# Patient Record
Sex: Female | Born: 1995 | Race: White | Hispanic: No | Smoking: Never smoker
Health system: Southern US, Community
[De-identification: ages and names within clinical notes are randomized; demographics above are authoritative.]

## PROBLEM LIST (undated history)

## (undated) ENCOUNTER — Emergency Department (HOSPITAL_COMMUNITY): Admission: EM | Payer: Medicaid Other

## (undated) DIAGNOSIS — M329 Systemic lupus erythematosus, unspecified: Secondary | ICD-10-CM

## (undated) DIAGNOSIS — IMO0002 Reserved for concepts with insufficient information to code with codable children: Secondary | ICD-10-CM

## (undated) DIAGNOSIS — D649 Anemia, unspecified: Secondary | ICD-10-CM

## (undated) HISTORY — DX: Systemic lupus erythematosus, unspecified: M32.9

## (undated) HISTORY — PX: WISDOM TOOTH EXTRACTION: SHX21

## (undated) HISTORY — DX: Anemia, unspecified: D64.9

## (undated) HISTORY — DX: Reserved for concepts with insufficient information to code with codable children: IMO0002

---

## 2017-03-10 ENCOUNTER — Other Ambulatory Visit: Payer: Self-pay

## 2017-03-10 ENCOUNTER — Emergency Department (HOSPITAL_COMMUNITY)
Admission: EM | Admit: 2017-03-10 | Discharge: 2017-03-11 | Disposition: A | Discharge status: Home / Self Care | Payer: No Typology Code available for payment source | Attending: Emergency Medicine | Admitting: Emergency Medicine

## 2017-03-10 ENCOUNTER — Encounter (HOSPITAL_COMMUNITY): Payer: Self-pay | Admitting: Emergency Medicine

## 2017-03-10 ENCOUNTER — Emergency Department (HOSPITAL_COMMUNITY): Payer: No Typology Code available for payment source

## 2017-03-10 DIAGNOSIS — Y9389 Activity, other specified: Secondary | ICD-10-CM | POA: Diagnosis not present

## 2017-03-10 DIAGNOSIS — Y999 Unspecified external cause status: Secondary | ICD-10-CM | POA: Diagnosis not present

## 2017-03-10 DIAGNOSIS — R51 Headache: Secondary | ICD-10-CM | POA: Insufficient documentation

## 2017-03-10 DIAGNOSIS — Y92410 Unspecified street and highway as the place of occurrence of the external cause: Secondary | ICD-10-CM | POA: Diagnosis not present

## 2017-03-10 MED ORDER — ACETAMINOPHEN 325 MG PO TABS
650.0000 mg | ORAL_TABLET | Freq: Once | ORAL | Status: AC
  Administered 2017-03-10: 650 mg via ORAL
  Filled 2017-03-10: qty 2

## 2017-03-10 MED ORDER — IBUPROFEN 600 MG PO TABS: 600.0000 mg | ORAL_TABLET | Freq: Four times a day (QID) | ORAL | 0 refills | Status: DC | PRN

## 2017-03-10 MED ORDER — METHOCARBAMOL 500 MG PO TABS: 500.0000 mg | ORAL_TABLET | Freq: Two times a day (BID) | ORAL | 0 refills | Status: DC
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
## 2019-03-26 ENCOUNTER — Encounter: Payer: Self-pay | Admitting: General Practice

## 2019-04-01 ENCOUNTER — Other Ambulatory Visit: Payer: Self-pay

## 2019-04-01 ENCOUNTER — Ambulatory Visit (INDEPENDENT_AMBULATORY_CARE_PROVIDER_SITE_OTHER): Payer: Medicaid Other | Admitting: *Deleted

## 2019-04-01 DIAGNOSIS — Z3400 Encounter for supervision of normal first pregnancy, unspecified trimester: Secondary | ICD-10-CM | POA: Insufficient documentation

## 2019-04-01 MED ORDER — BLOOD PRESSURE MONITOR AUTOMAT DEVI: 1.0000 | Freq: Every day | 0 refills | Status: DC

## 2019-04-01 MED ORDER — GOJJI WEIGHT SCALE MISC: 1.0000 | Freq: Every day | 0 refills | Status: DC | PRN

## 2019-04-01 NOTE — Progress Notes (Signed)
  Virtual Visit via Telephone Note  I connected with Maysun Tess on 04/01/19 at  3:00 PM EST by telephone and verified that I am speaking with the correct person using two identifiers.  Location: Patient: Cheryl Davila MRN: [id number] Provider: Clovis Pu, RN   I discussed the limitations, risks, security and privacy concerns of performing an evaluation and management service by telephone and the availability of in person appointments. I also discussed with the patient that there may be a patient responsible charge related to this service. The patient expressed understanding and agreed to proceed.   History of Present Illness: PRENATAL INTAKE SUMMARY  Ms. Backhaus presents today New OB Nurse Interview.  OB History    Gravida  1   Para      Term      Preterm      AB      Living        SAB      TAB      Ectopic      Multiple      Live Births             I have reviewed the patient's medical, obstetrical, social, and family histories, medications, and available lab results.  SUBJECTIVE She complains of nausea without vomiting.   Observations/Objective: Initial nurse interview for history/labs (New OB)  EDD: 10/15/2019 by LMP GA: [redacted]w[redacted]d G1P0 FHT: non face to face interview  GENERAL APPEARANCE: non face to face interview  Assessment and Plan: Normal pregnancy Prenatal care-[facility] Patient complained of nausea but declined medication. Advised to try Vitamin B-6 100 mg BID, ginger ale and saltine crackers. Labs to be completed at next visit with midwife Next appointment 04/16/19 with Steward Drone, CNM Patient to sign up for [employer] Rx for BP monitor and weight scale sent to [facility] Patient to continue PNV   Follow Up Instructions:   I discussed the assessment and treatment plan with the patient. The patient was provided an opportunity to ask questions and all were answered. The patient agreed with the plan and demonstrated an  understanding of the instructions.   The patient was advised to call back or seek an in-person evaluation if the symptoms worsen or if the condition fails to improve as anticipated.  I provided 20 minutes of non-face-to-face time during this encounter.   Clovis Pu, RN

## 2019-04-16 ENCOUNTER — Ambulatory Visit (INDEPENDENT_AMBULATORY_CARE_PROVIDER_SITE_OTHER): Payer: Medicaid Other | Admitting: Certified Nurse Midwife

## 2019-04-16 ENCOUNTER — Encounter: Payer: Self-pay | Admitting: Certified Nurse Midwife

## 2019-04-16 ENCOUNTER — Other Ambulatory Visit: Payer: Self-pay

## 2019-04-16 ENCOUNTER — Other Ambulatory Visit (HOSPITAL_COMMUNITY)
Admission: RE | Admit: 2019-04-16 | Discharge: 2019-04-16 | Disposition: A | Discharge status: Home / Self Care | Payer: Medicaid Other | Source: Ambulatory Visit | Attending: Certified Nurse Midwife | Admitting: Certified Nurse Midwife

## 2019-04-16 DIAGNOSIS — Z3400 Encounter for supervision of normal first pregnancy, unspecified trimester: Secondary | ICD-10-CM | POA: Diagnosis not present

## 2019-04-16 DIAGNOSIS — Z3A14 14 weeks gestation of pregnancy: Secondary | ICD-10-CM | POA: Diagnosis not present

## 2019-04-16 NOTE — Progress Notes (Signed)
History:   Cheryl Davila is a 24 y.o. G1P0 at [redacted]w[redacted]d by LMP being seen today for her first obstetrical visit.  Her obstetrical history is significant for nothing. Patient does intend to breast feed. Pregnancy history fully reviewed.  Patient reports vaginal discharge. She reports increased vaginal discharge over the past 2 weeks, describes as white thin discharge without odor. Denies irritation or itching. She reports having nausea and vomiting in the first trimester which she used ginger as treatment, reports N/V has resolved.      HISTORY: OB History  Gravida Para Term Preterm AB Living  1 0 0 0 0 0  SAB TAB Ectopic Multiple Live Births  0 0 0 0 0    # Outcome Date GA Lbr Len/2nd Weight Sex Delivery Anes PTL Lv  1 Current             Last pap smear was done 2020 at [facility] and was normal per patient, request of records sent to [facility]   History reviewed. No pertinent past medical history. History reviewed. No pertinent surgical history. Family History  Problem Relation Age of Onset  . Depression Mother   . Diabetes Maternal Grandmother    Social History   Tobacco Use  . Smoking status: Never Smoker  . Smokeless tobacco: Never Used  Substance Use Topics  . Alcohol use: No  . Drug use: No   No Known Allergies Current Outpatient Medications on File Prior to Visit  Medication Sig Dispense Refill  . Blood Pressure Monitoring (BLOOD PRESSURE MONITOR AUTOMAT) DEVI 1 Device by Does not apply route daily. Automatic blood pressure cuff regular size. To monitor blood pressure regularly at home. ICD-10 code: O09.90 1 each 0  . Misc. Devices (GOJJI WEIGHT SCALE) MISC 1 Device by Does not apply route daily as needed. To weight self daily as needed at home. ICD-10 code: O09.90 1 each 0  . Prenatal Vit-Fe Fumarate-FA (M-NATAL PLUS) 27-1 MG TABS Take 1 tablet by mouth daily.     No current facility-administered medications on file prior to visit.    Review of Systems Pertinent items noted  in HPI and remainder of comprehensive ROS otherwise negative. Physical Exam:   Vitals:   04/16/19 1528  BP: 112/70  Pulse: 85  Temp: 98.3 F (36.8 C)  Weight: 162 lb 9.6 oz (73.8 kg)   Fetal Heart Rate (bpm): 159 System: General: well-developed, well-nourished female in no acute distress   Breasts:  normal appearance, no masses or tenderness bilaterally   Skin: normal coloration and turgor, no rashes   Neurologic: oriented, normal, negative, normal mood   Extremities: normal strength, tone, and muscle mass, ROM of all joints is normal   HEENT PERRLA, extraocular movement intact and sclera clear, anicteric   Mouth/Teeth mucous membranes moist, pharynx normal without lesions and dental hygiene good   Neck supple and no masses   Cardiovascular: regular rate and rhythm   Respiratory:  no respiratory distress, normal breath sounds   Abdomen: soft, non-tender; bowel sounds normal; no masses,  no organomegaly     Assessment:    Pregnancy: G1P0 Patient Active Problem List   Diagnosis Date Noted  . Supervision of normal first pregnancy, antepartum 04/01/2019     Plan:    1. Supervision of normal first pregnancy, antepartum - Welcomed to practice and introduced self to patient  - Reviewed safety, visitor policy, reassurance about COVID-19 for pregnancy at this time. Discussed possible changes to visits, including televisits, that may occur  due to COVID-19.  The office remains open if pt needs to be seen and [facility]U is open 24 hours/day for OB emergencies. - Anticipatory guidance on upcoming appointments  - Educated and discussed AFP at next prenatal visit  - Discussed safe medications during pregnancy and list given  - Cervicovaginal ancillary only( [place]) - Obstetric Panel, Including HIV - Genetic Screening - Culture, OB Urine - Hepatitis C Antibody - [place] MFM OB COMP + 14 WK; Future   Initial labs drawn. Continue prenatal vitamins. Genetic Screening discussed, NIPS:  ordered. Ultrasound discussed; fetal anatomic survey: ordered. Problem list reviewed and updated. The nature of [facility] with multiple MDs and other Advanced Practice Providers was explained to patient; also emphasized that residents, students are part of our team. Routine obstetric precautions reviewed. Return in about 4 weeks (around 05/14/2019) for ROB/AFP.     Lajean Manes, [place] for [employer], [place]

## 2019-04-16 NOTE — Patient Instructions (Signed)
Second Trimester of Pregnancy  The second trimester is from week 14 through week 27 (month 4 through 6). This is often the time in pregnancy that you feel your best. Often times, morning sickness has lessened or quit. You may have more energy, and you may get hungry more often. Your unborn baby is growing rapidly. At the end of the sixth month, he or she is about 9 inches long and weighs about 1 pounds. You will likely feel the baby move between 18 and 20 weeks of pregnancy. Follow these instructions at home: Medicines  Take over-the-counter and prescription medicines only as told by your doctor. Some medicines are safe and some medicines are not safe during pregnancy.  Take a prenatal vitamin that contains at least 600 micrograms (mcg) of folic acid.  If you have trouble pooping (constipation), take medicine that will make your stool soft (stool softener) if your doctor approves. Eating and drinking   Eat regular, healthy meals.  Avoid raw meat and uncooked cheese.  If you get low calcium from the food you eat, talk to your doctor about taking a daily calcium supplement.  Avoid foods that are high in fat and sugars, such as fried and sweet foods.  If you feel sick to your stomach (nauseous) or throw up (vomit): ? Eat 4 or 5 small meals a day instead of 3 large meals. ? Try eating a few soda crackers. ? Drink liquids between meals instead of during meals.  To prevent constipation: ? Eat foods that are high in fiber, like fresh fruits and vegetables, whole grains, and beans. ? Drink enough fluids to keep your pee (urine) clear or pale yellow. Activity  Exercise only as told by your doctor. Stop exercising if you start to have cramps.  Do not exercise if it is too hot, too humid, or if you are in a place of great height (high altitude).  Avoid heavy lifting.  Wear low-heeled shoes. Sit and stand up straight.  You can continue to have sex unless your doctor tells you not  to. Relieving pain and discomfort  Wear a good support bra if your breasts are tender.  Take warm water baths (sitz baths) to soothe pain or discomfort caused by hemorrhoids. Use hemorrhoid cream if your doctor approves.  Rest with your legs raised if you have leg cramps or low back pain.  If you develop puffy, bulging veins (varicose veins) in your legs: ? Wear support hose or compression stockings as told by your doctor. ? Raise (elevate) your feet for 15 minutes, 3-4 times a day. ? Limit salt in your food. Prenatal care  Write down your questions. Take them to your prenatal visits.  Keep all your prenatal visits as told by your doctor. This is important. Safety  Wear your seat belt when driving.  Make a list of emergency phone numbers, including numbers for family, friends, the hospital, and police and fire departments. General instructions  Ask your doctor about the right foods to eat or for help finding a counselor, if you need these services.  Ask your doctor about local prenatal classes. Begin classes before month 6 of your pregnancy.  Do not use hot tubs, steam rooms, or saunas.  Do not douche or use tampons or scented sanitary pads.  Do not cross your legs for long periods of time.  Visit your dentist if you have not done so. Use a soft toothbrush to brush your teeth. Floss gently.  Avoid all smoking, herbs,   and alcohol. Avoid drugs that are not approved by your doctor.  Do not use any products that contain nicotine or tobacco, such as cigarettes and e-cigarettes. If you need help quitting, ask your doctor.  Avoid cat litter boxes and soil used by cats. These carry germs that can cause birth defects in the baby and can cause a loss of your baby (miscarriage) or stillbirth. Contact a doctor if:  You have mild cramps or pressure in your lower belly.  You have pain when you pee (urinate).  You have bad smelling fluid coming from your vagina.  You continue to  feel sick to your stomach (nauseous), throw up (vomit), or have watery poop (diarrhea).  You have a nagging pain in your belly area.  You feel dizzy. Get help right away if:  You have a fever.  You are leaking fluid from your vagina.  You have spotting or bleeding from your vagina.  You have severe belly cramping or pain.  You lose or gain weight rapidly.  You have trouble catching your breath and have chest pain.  You notice sudden or extreme puffiness (swelling) of your face, hands, ankles, feet, or legs.  You have not felt the baby move in over an hour.  You have severe headaches that do not go away when you take medicine.  You have trouble seeing. Summary  The second trimester is from week 14 through week 27 (months 4 through 6). This is often the time in pregnancy that you feel your best.  To take care of yourself and your unborn baby, you will need to eat healthy meals, take medicines only if your doctor tells you to do so, and do activities that are safe for you and your baby.  Call your doctor if you get sick or if you notice anything unusual about your pregnancy. Also, call your doctor if you need help with the right food to eat, or if you want to know what activities are safe for you. This information is not intended to replace advice given to you by your health care provider. Make sure you discuss any questions you have with your health care provider. Document Revised: 05/31/2018 Document Reviewed: 03/14/2016 [employer] Patient Education  2020 [employer]  Safe Medications in Pregnancy   Acne: Benzoyl Peroxide Salicylic Acid  Backache/Headache: Tylenol: 2 regular strength every 4 hours OR              2 Extra strength every 6 hours  Colds/Coughs/Allergies: Benadryl (alcohol free) 25 mg every 6 hours as needed Breath right strips Claritin Cepacol throat lozenges Chloraseptic throat spray Cold-Eeze- up to three times per day Cough drops, alcohol  free Flonase (by prescription only) Guaifenesin Mucinex Robitussin DM (plain only, alcohol free) Saline nasal spray/drops Sudafed (pseudoephedrine) & Actifed ** use only after [redacted] weeks gestation and if you do not have high blood pressure Tylenol Vicks Vaporub Zinc lozenges Zyrtec   Constipation: Colace Ducolax suppositories Fleet enema Glycerin suppositories Metamucil Milk of magnesia Miralax Senokot Smooth move tea  Diarrhea: Kaopectate Imodium A-D  *NO pepto Bismol  Hemorrhoids: Anusol Anusol HC Preparation H Tucks  Indigestion: Tums Maalox Mylanta Zantac  Pepcid  Insomnia: Benadryl (alcohol free) 25mg every 6 hours as needed Tylenol PM Unisom, no Gelcaps  Leg Cramps: Tums MagGel  Nausea/Vomiting:  Bonine Dramamine Emetrol Ginger extract Sea bands Meclizine  Nausea medication to take during pregnancy:  Unisom (doxylamine succinate 25 mg tablets) Take one tablet daily at bedtime. If symptoms are   not adequately controlled, the dose can be increased to a maximum recommended dose of two tablets daily (1/2 tablet in the morning, 1/2 tablet mid-afternoon and one at bedtime). Vitamin B6 100mg tablets. Take one tablet twice a day (up to 200 mg per day).  Skin Rashes: Aveeno products Benadryl cream or 25mg every 6 hours as needed Calamine Lotion 1% cortisone cream  Yeast infection: Gyne-lotrimin 7 Monistat 7   **If taking multiple medications, please check labels to avoid duplicating the same active ingredients **take medication as directed on the label ** Do not exceed 4000 mg of tylenol in 24 hours **Do not take medications that contain aspirin or ibuprofen     

## 2019-04-18 ENCOUNTER — Encounter: Payer: Self-pay | Admitting: General Practice

## 2019-04-18 LAB — OBSTETRIC PANEL, INCLUDING HIV
Basophils Absolute: 0 10*3/uL (ref 0.0–0.2)
Basos: 0 %
EOS (ABSOLUTE): 0.1 10*3/uL (ref 0.0–0.4)
Eos: 1 %
HIV Screen 4th Generation wRfx: NONREACTIVE
Hematocrit: 35.8 % (ref 34.0–46.6)
Hemoglobin: 12 g/dL (ref 11.1–15.9)
Hepatitis B Surface Ag: NEGATIVE
Immature Grans (Abs): 0 10*3/uL (ref 0.0–0.1)
Immature Granulocytes: 0 %
Lymphocytes Absolute: 2.2 10*3/uL (ref 0.7–3.1)
Lymphs: 30 %
MCH: 27.1 pg (ref 26.6–33.0)
MCHC: 33.5 g/dL (ref 31.5–35.7)
MCV: 81 fL (ref 79–97)
Monocytes Absolute: 0.5 10*3/uL (ref 0.1–0.9)
Monocytes: 7 %
Neutrophils Absolute: 4.5 10*3/uL (ref 1.4–7.0)
Neutrophils: 62 %
Platelets: 250 10*3/uL (ref 150–450)
RBC: 4.42 x10E6/uL (ref 3.77–5.28)
RDW: 16.2 % — ABNORMAL HIGH (ref 11.7–15.4)
RPR Ser Ql: REACTIVE — AB
Rh Factor: POSITIVE
Rubella Antibodies, IGG: 3.94 index (ref >0.99)
WBC: 7.3 10*3/uL (ref 3.4–10.8)

## 2019-04-18 LAB — CERVICOVAGINAL ANCILLARY ONLY
Bacterial Vaginitis (gardnerella): POSITIVE — AB
Candida Glabrata: NEGATIVE
Candida Vaginitis: POSITIVE — AB
Chlamydia: NEGATIVE
Comment: NEGATIVE
Comment: NEGATIVE
Comment: NEGATIVE
Comment: NEGATIVE
Comment: NEGATIVE
Comment: NORMAL
Neisseria Gonorrhea: NEGATIVE
Trichomonas: NEGATIVE

## 2019-04-18 LAB — RPR, QUANT+TP ABS (REFLEX)
Rapid Plasma Reagin, Quant: 1:2 {titer} — ABNORMAL HIGH
T Pallidum Abs: NONREACTIVE

## 2019-04-18 LAB — AB SCR+ANTIBODY ID: Antibody Screen: POSITIVE — AB

## 2019-04-18 LAB — HEPATITIS C ANTIBODY: Hep C Virus Ab: <0.1 s/co ratio (ref 0.0–0.9)

## 2019-04-19 LAB — CULTURE, OB URINE

## 2019-04-19 LAB — URINE CULTURE, OB REFLEX

## 2019-04-23 ENCOUNTER — Encounter: Payer: Self-pay | Admitting: Certified Nurse Midwife

## 2019-04-23 DIAGNOSIS — R768 Other specified abnormal immunological findings in serum: Secondary | ICD-10-CM | POA: Insufficient documentation

## 2019-04-28 ENCOUNTER — Other Ambulatory Visit: Payer: Self-pay | Admitting: *Deleted

## 2019-04-28 ENCOUNTER — Encounter: Payer: Self-pay | Admitting: General Practice

## 2019-05-01 ENCOUNTER — Encounter: Payer: Self-pay | Admitting: General Practice

## 2019-05-14 ENCOUNTER — Encounter: Payer: Medicaid Other | Admitting: Certified Nurse Midwife

## 2019-05-21 ENCOUNTER — Ambulatory Visit (HOSPITAL_COMMUNITY)
Admission: RE | Admit: 2019-05-21 | Discharge: 2019-05-21 | Disposition: A | Discharge status: Home / Self Care | Payer: Medicaid Other | Source: Ambulatory Visit | Attending: Obstetrics and Gynecology | Admitting: Obstetrics and Gynecology

## 2019-05-21 ENCOUNTER — Other Ambulatory Visit: Payer: Self-pay

## 2019-05-21 DIAGNOSIS — Z3400 Encounter for supervision of normal first pregnancy, unspecified trimester: Secondary | ICD-10-CM | POA: Diagnosis present

## 2019-05-21 DIAGNOSIS — Z3A18 18 weeks gestation of pregnancy: Secondary | ICD-10-CM

## 2019-05-21 DIAGNOSIS — Z363 Encounter for antenatal screening for malformations: Secondary | ICD-10-CM

## 2019-06-18 ENCOUNTER — Inpatient Hospital Stay (HOSPITAL_COMMUNITY)
Admission: AD | Admit: 2019-06-18 | Discharge: 2019-06-18 | Disposition: A | Discharge status: Home / Self Care | Payer: Medicaid Other | Attending: Obstetrics and Gynecology | Admitting: Obstetrics and Gynecology

## 2019-06-18 ENCOUNTER — Inpatient Hospital Stay (HOSPITAL_BASED_OUTPATIENT_CLINIC_OR_DEPARTMENT_OTHER): Payer: Medicaid Other

## 2019-06-18 ENCOUNTER — Encounter (HOSPITAL_COMMUNITY): Payer: Self-pay | Admitting: Obstetrics and Gynecology

## 2019-06-18 ENCOUNTER — Ambulatory Visit (INDEPENDENT_AMBULATORY_CARE_PROVIDER_SITE_OTHER): Payer: Medicaid Other | Admitting: Women's Health

## 2019-06-18 ENCOUNTER — Other Ambulatory Visit: Payer: Self-pay

## 2019-06-18 VITALS — BP 108/71 | HR 80 | Temp 97.4°F | Wt 167.4 lb

## 2019-06-18 DIAGNOSIS — Z3A23 23 weeks gestation of pregnancy: Secondary | ICD-10-CM | POA: Diagnosis not present

## 2019-06-18 DIAGNOSIS — O368320 Maternal care for abnormalities of the fetal heart rate or rhythm, second trimester, not applicable or unspecified: Secondary | ICD-10-CM | POA: Insufficient documentation

## 2019-06-18 DIAGNOSIS — O368390 Maternal care for abnormalities of the fetal heart rate or rhythm, unspecified trimester, not applicable or unspecified: Secondary | ICD-10-CM | POA: Diagnosis not present

## 2019-06-18 DIAGNOSIS — R768 Other specified abnormal immunological findings in serum: Secondary | ICD-10-CM

## 2019-06-18 DIAGNOSIS — Z3400 Encounter for supervision of normal first pregnancy, unspecified trimester: Secondary | ICD-10-CM

## 2019-06-18 DIAGNOSIS — Z3A22 22 weeks gestation of pregnancy: Secondary | ICD-10-CM | POA: Diagnosis not present

## 2019-06-18 DIAGNOSIS — R760 Raised antibody titer: Secondary | ICD-10-CM

## 2019-06-18 NOTE — Patient Instructions (Addendum)
Maternity Assessment Unit (MAU)  The Maternity Assessment Unit (MAU) is located at the [facility]. The address is: [address], [place], [place], [place] [address]. Please see map below for additional directions.    The Maternity Assessment Unit is designed to help you during your pregnancy, and for up to 6 weeks after delivery, with any pregnancy- or postpartum-related emergencies, if you think you are in labor, or if your water has broken. For example, if you experience nausea and vomiting, vaginal bleeding, severe abdominal or pelvic pain, elevated blood pressure or other problems related to your pregnancy or postpartum time, please come to the Maternity Assessment Unit for assistance.       Alpha-Fetoprotein Test Why am I having this test? The alpha-fetoprotein test is most commonly used in pregnant women to help screen for birth defects in their unborn baby. It can be used to screen for birth defects, such as chromosome (DNA) abnormalities, problems with the brain or spinal cord, or problems with the abdominal wall of the unborn baby (fetus). The alpha-fetoprotein test may also be done for men or non-pregnant women to check for certain cancers. What is being tested? This test measures the amount of alpha-fetoprotein (AFP) in your blood. AFP is a protein that is made by the liver. Levels can be detected in the mother's blood during pregnancy, starting at 10 weeks and peaking at 16-18 weeks of the pregnancy. Abnormal levels can sometimes be a sign of a birth defect in the baby. Certain cancers can cause a high level of AFP in men and non-pregnant women. What kind of sample is taken?  A blood sample is required for this test. It is usually collected by inserting a needle into a blood vessel. How are the results reported? Your test results will be reported as values. Your health care provider will compare your results to normal ranges that  were established after testing a large group of people (reference values). Reference values may vary among labs and hospitals. For this test, common reference values are:  Adult: Less than 40 ng/mL or less than 40 mcg/L (SI units).  Child younger than 1 year: Less than 30 ng/mL. If you are pregnant, the values may also vary based on how long you have been pregnant. What do the results mean? Results that are above the reference values in pregnant women may indicate the following for the baby:  Neural tube defects, such as abnormalities of the spinal cord or brain.  Abdominal wall defects.  Multiple pregnancy such as twins.  Fetal distress or fetal death. Results that are above the reference values in men or non-pregnant women may indicate:  Reproductive cancers, such as ovarian or testicular cancer.  Liver cancer.  Liver cell death.  Other types of cancer. Very low levels of AFP in pregnant women may indicate the following for the baby:  Down syndrome.  Fetal death. Talk with your health care provider about what your results mean. Questions to ask your health care provider Ask your health care provider, or the department that is doing the test:  When will my results be ready?  How will I get my results?  What are my treatment options?  What other tests do I need?  What are my next steps? Summary  The alpha-fetoprotein test is done on pregnant women to help screen for birth defects in their unborn baby.  Certain cancers can cause a high level of AFP in men  and non-pregnant women.  For this test, a blood sample is usually collected by inserting a needle into a blood vessel.  Talk with your health care provider about what your results mean. This information is not intended to replace advice given to you by your health care provider. Make sure you discuss any questions you have with your health care provider. Document Revised: 01/19/2017 Document Reviewed:  09/12/2016 Elsevier Patient Education  2020 [employer].     Second Trimester of Pregnancy The second trimester is from week 14 through week 27 (months 4 through 6). The second trimester is often a time when you feel your best. Your body has adjusted to being pregnant, and you begin to feel better physically. Usually, morning sickness has lessened or quit completely, you may have more energy, and you may have an increase in appetite. The second trimester is also a time when the fetus is growing rapidly. At the end of the sixth month, the fetus is about 9 inches long and weighs about 1 pounds. You will likely begin to feel the baby move (quickening) between 16 and 20 weeks of pregnancy. Body changes during your second trimester Your body continues to go through many changes during your second trimester. The changes vary from woman to woman.  Your weight will continue to increase. You will notice your lower abdomen bulging out.  You may begin to get stretch marks on your hips, abdomen, and breasts.  You may develop headaches that can be relieved by medicines. The medicines should be approved by your health care provider.  You may urinate more often because the fetus is pressing on your bladder.  You may develop or continue to have heartburn as a result of your pregnancy.  You may develop constipation because certain hormones are causing the muscles that push waste through your intestines to slow down.  You may develop hemorrhoids or swollen, bulging veins (varicose veins).  You may have back pain. This is caused by: ? Weight gain. ? Pregnancy hormones that are relaxing the joints in your pelvis. ? A shift in weight and the muscles that support your balance.  Your breasts will continue to grow and they will continue to become tender.  Your gums may bleed and may be sensitive to brushing and flossing.  Dark spots or blotches (chloasma, mask of pregnancy) may develop on your face.  This will likely fade after the baby is born.  A dark line from your belly button to the pubic area (linea nigra) may appear. This will likely fade after the baby is born.  You may have changes in your hair. These can include thickening of your hair, rapid growth, and changes in texture. Some women also have hair loss during or after pregnancy, or hair that feels dry or thin. Your hair will most likely return to normal after your baby is born. What to expect at prenatal visits During a routine prenatal visit:  You will be weighed to make sure you and the fetus are growing normally.  Your blood pressure will be taken.  Your abdomen will be measured to track your baby's growth.  The fetal heartbeat will be listened to.  Any test results from the previous visit will be discussed. Your health care provider may ask you:  How you are feeling.  If you are feeling the baby move.  If you have had any abnormal symptoms, such as leaking fluid, bleeding, severe headaches, or abdominal cramping.  If you are using any  tobacco products, including cigarettes, chewing tobacco, and electronic cigarettes.  If you have any questions. Other tests that may be performed during your second trimester include:  Blood tests that check for: ? Low iron levels (anemia). ? High blood sugar that affects pregnant women (gestational diabetes) between 6624 and 28 weeks. ? Rh antibodies. This is to check for a protein on red blood cells (Rh factor).  Urine tests to check for infections, diabetes, or protein in the urine.  An ultrasound to confirm the proper growth and development of the baby.  An amniocentesis to check for possible genetic problems.  Fetal screens for spina bifida and Down syndrome.  HIV (human immunodeficiency virus) testing. Routine prenatal testing includes screening for HIV, unless you choose not to have this test. Follow these instructions at home: Medicines  Follow your health care  provider's instructions regarding medicine use. Specific medicines may be either safe or unsafe to take during pregnancy.  Take a prenatal vitamin that contains at least 600 micrograms (mcg) of folic acid.  If you develop constipation, try taking a stool softener if your health care provider approves. Eating and drinking   Eat a balanced diet that includes fresh fruits and vegetables, whole grains, good sources of protein such as meat, eggs, or tofu, and low-fat dairy. Your health care provider will help you determine the amount of weight gain that is right for you.  Avoid raw meat and uncooked cheese. These carry germs that can cause birth defects in the baby.  If you have low calcium intake from food, talk to your health care provider about whether you should take a daily calcium supplement.  Limit foods that are high in fat and processed sugars, such as fried and sweet foods.  To prevent constipation: ? Drink enough fluid to keep your urine clear or pale yellow. ? Eat foods that are high in fiber, such as fresh fruits and vegetables, whole grains, and beans. Activity  Exercise only as directed by your health care provider. Most women can continue their usual exercise routine during pregnancy. Try to exercise for 30 minutes at least 5 days a week. Stop exercising if you experience uterine contractions.  Avoid heavy lifting, wear low heel shoes, and practice good posture.  A sexual relationship may be continued unless your health care provider directs you otherwise. Relieving pain and discomfort  Wear a good support bra to prevent discomfort from breast tenderness.  Take warm sitz baths to soothe any pain or discomfort caused by hemorrhoids. Use hemorrhoid cream if your health care provider approves.  Rest with your legs elevated if you have leg cramps or low back pain.  If you develop varicose veins, wear support hose. Elevate your feet for 15 minutes, 3-4 times a day. Limit salt  in your diet. Prenatal Care  Write down your questions. Take them to your prenatal visits.  Keep all your prenatal visits as told by your health care provider. This is important. Safety  Wear your seat belt at all times when driving.  Make a list of emergency phone numbers, including numbers for family, friends, the hospital, and police and fire departments. General instructions  Ask your health care provider for a referral to a local prenatal education class. Begin classes no later than the beginning of month 6 of your pregnancy.  Ask for help if you have counseling or nutritional needs during pregnancy. Your health care provider can offer advice or refer you to specialists for help with various needs.  Do not use hot tubs, steam rooms, or saunas.  Do not douche or use tampons or scented sanitary pads.  Do not cross your legs for long periods of time.  Avoid cat litter boxes and soil used by cats. These carry germs that can cause birth defects in the baby and possibly loss of the fetus by miscarriage or stillbirth.  Avoid all smoking, herbs, alcohol, and unprescribed drugs. Chemicals in these products can affect the formation and growth of the baby.  Do not use any products that contain nicotine or tobacco, such as cigarettes and e-cigarettes. If you need help quitting, ask your health care provider.  Visit your dentist if you have not gone yet during your pregnancy. Use a soft toothbrush to brush your teeth and be gentle when you floss. Contact a health care provider if:  You have dizziness.  You have mild pelvic cramps, pelvic pressure, or nagging pain in the abdominal area.  You have persistent nausea, vomiting, or diarrhea.  You have a bad smelling vaginal discharge.  You have pain when you urinate. Get help right away if:  You have a fever.  You are leaking fluid from your vagina.  You have spotting or bleeding from your vagina.  You have severe abdominal  cramping or pain.  You have rapid weight gain or weight loss.  You have shortness of breath with chest pain.  You notice sudden or extreme swelling of your face, hands, ankles, feet, or legs.  You have not felt your baby move in over an hour.  You have severe headaches that do not go away when you take medicine.  You have vision changes. Summary  The second trimester is from week 14 through week 27 (months 4 through 6). It is also a time when the fetus is growing rapidly.  Your body goes through many changes during pregnancy. The changes vary from woman to woman.  Avoid all smoking, herbs, alcohol, and unprescribed drugs. These chemicals affect the formation and growth your baby.  Do not use any tobacco products, such as cigarettes, chewing tobacco, and e-cigarettes. If you need help quitting, ask your health care provider.  Contact your health care provider if you have any questions. Keep all prenatal visits as told by your health care provider. This is important. This information is not intended to replace advice given to you by your health care provider. Make sure you discuss any questions you have with your health care provider. Document Revised: 05/31/2018 Document Reviewed: 03/14/2016 Elsevier Patient Education  2020 [employer].        Third Trimester of Pregnancy The third trimester is from week 28 through week 40 (months 7 through 9). The third trimester is a time when the unborn baby (fetus) is growing rapidly. At the end of the ninth month, the fetus is about 20 inches in length and weighs 6-10 pounds. Body changes during your third trimester Your body will continue to go through many changes during pregnancy. The changes vary from woman to woman. During the third trimester:  Your weight will continue to increase. You can expect to gain 25-35 pounds (11-16 kg) by the end of the pregnancy.  You may begin to get stretch marks on your hips, abdomen, and  breasts.  You may urinate more often because the fetus is moving lower into your pelvis and pressing on your bladder.  You may develop or continue to have heartburn. This is caused by increased hormones that slow down muscles in  the digestive tract.  You may develop or continue to have constipation because increased hormones slow digestion and cause the muscles that push waste through your intestines to relax.  You may develop hemorrhoids. These are swollen veins (varicose veins) in the rectum that can itch or be painful.  You may develop swollen, bulging veins (varicose veins) in your legs.  You may have increased body aches in the pelvis, back, or thighs. This is due to weight gain and increased hormones that are relaxing your joints.  You may have changes in your hair. These can include thickening of your hair, rapid growth, and changes in texture. Some women also have hair loss during or after pregnancy, or hair that feels dry or thin. Your hair will most likely return to normal after your baby is born.  Your breasts will continue to grow and they will continue to become tender. A yellow fluid (colostrum) may leak from your breasts. This is the first milk you are producing for your baby.  Your belly button may stick out.  You may notice more swelling in your hands, face, or ankles.  You may have increased tingling or numbness in your hands, arms, and legs. The skin on your belly may also feel numb.  You may feel short of breath because of your expanding uterus.  You may have more problems sleeping. This can be caused by the size of your belly, increased need to urinate, and an increase in your body's metabolism.  You may notice the fetus "dropping," or moving lower in your abdomen (lightening).  You may have increased vaginal discharge.  You may notice your joints feel loose and you may have pain around your pelvic bone. What to expect at prenatal visits You will have prenatal  exams every 2 weeks until week 36. Then you will have weekly prenatal exams. During a routine prenatal visit:  You will be weighed to make sure you and the baby are growing normally.  Your blood pressure will be taken.  Your abdomen will be measured to track your baby's growth.  The fetal heartbeat will be listened to.  Any test results from the previous visit will be discussed.  You may have a cervical check near your due date to see if your cervix has softened or thinned (effaced).  You will be tested for Group B streptococcus. This happens between 35 and 37 weeks. Your health care provider may ask you:  What your birth plan is.  How you are feeling.  If you are feeling the baby move.  If you have had any abnormal symptoms, such as leaking fluid, bleeding, severe headaches, or abdominal cramping.  If you are using any tobacco products, including cigarettes, chewing tobacco, and electronic cigarettes.  If you have any questions. Other tests or screenings that may be performed during your third trimester include:  Blood tests that check for low iron levels (anemia).  Fetal testing to check the health, activity level, and growth of the fetus. Testing is done if you have certain medical conditions or if there are problems during the pregnancy.  Nonstress test (NST). This test checks the health of your baby to make sure there are no signs of problems, such as the baby not getting enough oxygen. During this test, a belt is placed around your belly. The baby is made to move, and its heart rate is monitored during movement. What is false labor? False labor is a condition in which you feel small, irregular tightenings  of the muscles in the womb (contractions) that usually go away with rest, changing position, or drinking water. These are called Braxton Hicks contractions. Contractions may last for hours, days, or even weeks before true labor sets in. If contractions come at regular  intervals, become more frequent, increase in intensity, or become painful, you should see your health care provider. What are the signs of labor?  Abdominal cramps.  Regular contractions that start at 10 minutes apart and become stronger and more frequent with time.  Contractions that start on the top of the uterus and spread down to the lower abdomen and back.  Increased pelvic pressure and dull back pain.  A watery or bloody mucus discharge that comes from the vagina.  Leaking of amniotic fluid. This is also known as your "water breaking." It could be a slow trickle or a gush. Let your health care provider know if it has a color or strange odor. If you have any of these signs, call your health care provider right away, even if it is before your due date. Follow these instructions at home: Medicines  Follow your health care provider's instructions regarding medicine use. Specific medicines may be either safe or unsafe to take during pregnancy.  Take a prenatal vitamin that contains at least 600 micrograms (mcg) of folic acid.  If you develop constipation, try taking a stool softener if your health care provider approves. Eating and drinking   Eat a balanced diet that includes fresh fruits and vegetables, whole grains, good sources of protein such as meat, eggs, or tofu, and low-fat dairy. Your health care provider will help you determine the amount of weight gain that is right for you.  Avoid raw meat and uncooked cheese. These carry germs that can cause birth defects in the baby.  If you have low calcium intake from food, talk to your health care provider about whether you should take a daily calcium supplement.  Eat four or five small meals rather than three large meals a day.  Limit foods that are high in fat and processed sugars, such as fried and sweet foods.  To prevent constipation: ? Drink enough fluid to keep your urine clear or pale yellow. ? Eat foods that are high  in fiber, such as fresh fruits and vegetables, whole grains, and beans. Activity  Exercise only as directed by your health care provider. Most women can continue their usual exercise routine during pregnancy. Try to exercise for 30 minutes at least 5 days a week. Stop exercising if you experience uterine contractions.  Avoid heavy lifting.  Do not exercise in extreme heat or humidity, or at high altitudes.  Wear low-heel, comfortable shoes.  Practice good posture.  You may continue to have sex unless your health care provider tells you otherwise. Relieving pain and discomfort  Take frequent breaks and rest with your legs elevated if you have leg cramps or low back pain.  Take warm sitz baths to soothe any pain or discomfort caused by hemorrhoids. Use hemorrhoid cream if your health care provider approves.  Wear a good support bra to prevent discomfort from breast tenderness.  If you develop varicose veins: ? Wear support pantyhose or compression stockings as told by your healthcare provider. ? Elevate your feet for 15 minutes, 3-4 times a day. Prenatal care  Write down your questions. Take them to your prenatal visits.  Keep all your prenatal visits as told by your health care provider. This is important. Safety  Wear  your seat belt at all times when driving.  Make a list of emergency phone numbers, including numbers for family, friends, the hospital, and police and fire departments. General instructions  Avoid cat litter boxes and soil used by cats. These carry germs that can cause birth defects in the baby. If you have a cat, ask someone to clean the litter box for you.  Do not travel far distances unless it is absolutely necessary and only with the approval of your health care provider.  Do not use hot tubs, steam rooms, or saunas.  Do not drink alcohol.  Do not use any products that contain nicotine or tobacco, such as cigarettes and e-cigarettes. If you need help  quitting, ask your health care provider.  Do not use any medicinal herbs or unprescribed drugs. These chemicals affect the formation and growth of the baby.  Do not douche or use tampons or scented sanitary pads.  Do not cross your legs for long periods of time.  To prepare for the arrival of your baby: ? Take prenatal classes to understand, practice, and ask questions about labor and delivery. ? Make a trial run to the hospital. ? Visit the hospital and tour the maternity area. ? Arrange for maternity or paternity leave through employers. ? Arrange for family and friends to take care of pets while you are in the hospital. ? Purchase a rear-facing car seat and make sure you know how to install it in your car. ? Pack your hospital bag. ? Prepare the baby's nursery. Make sure to remove all pillows and stuffed animals from the baby's crib to prevent suffocation.  Visit your dentist if you have not gone during your pregnancy. Use a soft toothbrush to brush your teeth and be gentle when you floss. Contact a health care provider if:  You are unsure if you are in labor or if your water has broken.  You become dizzy.  You have mild pelvic cramps, pelvic pressure, or nagging pain in your abdominal area.  You have lower back pain.  You have persistent nausea, vomiting, or diarrhea.  You have an unusual or bad smelling vaginal discharge.  You have pain when you urinate. Get help right away if:  Your water breaks before 37 weeks.  You have regular contractions less than 5 minutes apart before 37 weeks.  You have a fever.  You are leaking fluid from your vagina.  You have spotting or bleeding from your vagina.  You have severe abdominal pain or cramping.  You have rapid weight loss or weight gain.  You have shortness of breath with chest pain.  You notice sudden or extreme swelling of your face, hands, ankles, feet, or legs.  Your baby makes fewer than 10 movements in 2  hours.  You have severe headaches that do not go away when you take medicine.  You have vision changes. Summary  The third trimester is from week 28 through week 40, months 7 through 9. The third trimester is a time when the unborn baby (fetus) is growing rapidly.  During the third trimester, your discomfort may increase as you and your baby continue to gain weight. You may have abdominal, leg, and back pain, sleeping problems, and an increased need to urinate.  During the third trimester your breasts will keep growing and they will continue to become tender. A yellow fluid (colostrum) may leak from your breasts. This is the first milk you are producing for your baby.  False labor  is a condition in which you feel small, irregular tightenings of the muscles in the womb (contractions) that eventually go away. These are called Braxton Hicks contractions. Contractions may last for hours, days, or even weeks before true labor sets in.  Signs of labor can include: abdominal cramps; regular contractions that start at 10 minutes apart and become stronger and more frequent with time; watery or bloody mucus discharge that comes from the vagina; increased pelvic pressure and dull back pain; and leaking of amniotic fluid. This information is not intended to replace advice given to you by your health care provider. Make sure you discuss any questions you have with your health care provider. Document Revised: 05/30/2018 Document Reviewed: 03/14/2016 Elsevier Patient Education  [place].    Preterm Labor and Birth Information  The normal length of a pregnancy is 39-41 weeks. Preterm labor is when labor starts before 37 completed weeks of pregnancy. What are the risk factors for preterm labor? Preterm labor is more likely to occur in women who:  Have certain infections during pregnancy such as a bladder infection, sexually transmitted infection, or infection inside the uterus  (chorioamnionitis).  Have a shorter-than-normal cervix.  Have gone into preterm labor before.  Have had surgery on their cervix.  Are younger than age 90 or older than age 1.  Are African American.  Are pregnant with twins or multiple babies (multiple gestation).  Take street drugs or smoke while pregnant.  Do not gain enough weight while pregnant.  Became pregnant shortly after having been pregnant. What are the symptoms of preterm labor? Symptoms of preterm labor include:  Cramps similar to those that can happen during a menstrual period. The cramps may happen with diarrhea.  Pain in the abdomen or lower back.  Regular uterine contractions that may feel like tightening of the abdomen.  A feeling of increased pressure in the pelvis.  Increased watery or bloody mucus discharge from the vagina.  Water breaking (ruptured amniotic sac). Why is it important to recognize signs of preterm labor? It is important to recognize signs of preterm labor because babies who are born prematurely may not be fully developed. This can put them at an increased risk for:  Long-term (chronic) heart and lung problems.  Difficulty immediately after birth with regulating body systems, including blood sugar, body temperature, heart rate, and breathing rate.  Bleeding in the brain.  Cerebral palsy.  Learning difficulties.  Death. These risks are highest for babies who are born before 55 weeks of pregnancy. How is preterm labor treated? Treatment depends on the length of your pregnancy, your condition, and the health of your baby. It may involve:  Having a stitch (suture) placed in your cervix to prevent your cervix from opening too early (cerclage).  Taking or being given medicines, such as: ? Hormone medicines. These may be given early in pregnancy to help support the pregnancy. ? Medicine to stop contractions. ? Medicines to help mature the baby's lungs. These may be prescribed if the  risk of delivery is high. ? Medicines to prevent your baby from developing cerebral palsy. If the labor happens before 34 weeks of pregnancy, you may need to stay in the hospital. What should I do if I think I am in preterm labor? If you think that you are going into preterm labor, call your health care provider right away. How can I prevent preterm labor in future pregnancies? To increase your chance of having a full-term pregnancy:  Do not  use any tobacco products, such as cigarettes, chewing tobacco, and e-cigarettes. If you need help quitting, ask your health care provider.  Do not use street drugs or medicines that have not been prescribed to you during your pregnancy.  Talk with your health care provider before taking any herbal supplements, even if you have been taking them regularly.  Make sure you gain a healthy amount of weight during your pregnancy.  Watch for infection. If you think that you might have an infection, get it checked right away.  Make sure to tell your health care provider if you have gone into preterm labor before. This information is not intended to replace advice given to you by your health care provider. Make sure you discuss any questions you have with your health care provider. Document Revised: 05/31/2018 Document Reviewed: 06/30/2015 Elsevier Patient Education  2020 Elsevier Inc.       AREA PEDIATRIC/FAMILY PRACTICE PHYSICIANS  [facility] [address] [place], [place] [address] Phone - [phone]   Fax - [phone]  [facility]ACK AMOS [address] [place], [place]  [address] Phone - [phone]   Fax - [phone]  [facility] [address] [place], [place]  [address] Phone - [phone]   Fax - [phone]  [facility] [address] [place], [place]  [address] Phone - [phone]   Fax - [phone]  [facility] [address] [place], [place]  [address] Phone - [phone]   Fax - [phone]  [facility] [address] [place], [place]  [address] Phone - [phone]   Fax - [phone]  [facility] [place] [address] [place], [place]  [address] Phone - [phone]   Fax - [phone]  [facility] AT [facility] [address] [place], [address] [place], [place]  [address] Phone - [phone]   Fax - [phone]  [facility] AT [facility] [address] [place], [place]  [address] Phone - [phone]   Fax - [phone] [facility] [place] [address] [place], [place]  [address] Phone - [phone]   Fax - [phone]  [facility] [address] [place], [place]  [address] Phone - [phone]   Fax - [phone]  [facility] [address], [address] [place], [place]  [address] Phone - [phone]   Fax - [phone]  [facility] [place] [address] [place], [place]  [address] Phone - [phone]   Fax - [phone]  [facility] [address] [place], [place]  [address] Phone - [phone]  [facility] [address] [place], [place]  [address] Phone - [phone]   Fax - [phone]  [facility] [address] [place], [place]  [address] Phone - [phone]   Fax - [phone]  [facility] [address] [place], [place]  [address] Phone - [phone]   Fax - [phone]  [facility] [address] [place], [place]  [address] Phone - [phone]   Fax - [phone]   [facility] [address] [place], [place]  [address] Phone - [phone]   Fax - [phone]  [facility] [address] [place], [place]  [address] Phone - [phone]   Fax -  [phone]  DAVID RUBIN [address] [employer]  [address] [place], [place]  [address] Phone - [phone]   Fax - [phone]  [facility] [address] [place], [place]  [address] Phone - [phone]   Fax - [phone]  [facility] [place] [facility] Alita Chyle [address] [place], [place]  [address] Phone - [phone]   Fax - [phone] Gerarda Fraction [address] [place], [place]  [address] Phone - [phone]   Fax - [phone]  [facility] [address] [place], [place]  [address] Phone - [phone]   Fax - [phone]  [facility] - [place] [address] [place], [place]  [address] Phone - [phone]   Fax - [phone]                          Safe Medications in Pregnancy    Acne: Benzoyl Peroxide Salicylic Acid  Backache/Headache: Tylenol: 2 regular strength every 4 hours OR              2 Extra strength every 6 hours  Colds/Coughs/Allergies: Benadryl (alcohol free) 25 mg every 6 hours as needed Breath right strips Claritin Cepacol throat lozenges Chloraseptic throat spray Cold-Eeze- up to three times per day Cough drops, alcohol free Flonase (by prescription only) Guaifenesin Mucinex Robitussin DM (plain only, alcohol free) Saline nasal spray/drops Sudafed (pseudoephedrine) & Actifed ** use only after [redacted] weeks gestation and if you do not have high blood pressure Tylenol Vicks Vaporub Zinc lozenges Zyrtec   Constipation: Colace Ducolax suppositories Fleet enema Glycerin suppositories Metamucil Milk of magnesia Miralax Senokot Smooth move tea  Diarrhea: Kaopectate Imodium A-D  *NO pepto Bismol  Hemorrhoids: Anusol Anusol HC Preparation H Tucks  Indigestion: Tums Maalox Mylanta Zantac  Pepcid  Insomnia: Benadryl (alcohol free)  every 6 hours as needed Tylenol PM Unisom, no Gelcaps  Leg  Cramps: Tums MagGel  Nausea/Vomiting:  Bonine Dramamine Emetrol Ginger extract Sea bands Meclizine  Nausea medication to take during pregnancy:  Unisom (doxylamine succinate 25 mg tablets) Take one tablet daily at bedtime. If symptoms are not adequately controlled, the dose can be increased to a maximum recommended dose of two tablets daily (1/2 tablet in the morning, 1/2 tablet mid-afternoon and one at bedtime). Vitamin B6  tablets. Take one tablet twice a day (up to 200 mg per day).  Skin Rashes: Aveeno products Benadryl cream or  every 6 hours as needed Calamine Lotion 1% cortisone cream  Yeast infection: Gyne-lotrimin 7 Monistat 7   **If taking multiple medications, please check labels to avoid duplicating the same active ingredients **take medication as directed on the label ** Do not exceed 4000 mg of tylenol in 24 hours **Do not take medications that contain aspirin or ibuprofen

## 2019-06-18 NOTE — Progress Notes (Signed)
This RN informed in report that the provider has been notified and has done a bedside US.

## 2019-06-18 NOTE — MAU Note (Signed)
Cheryl Davila is a 24 y.o. at [redacted]w[redacted]d here in MAU reporting: sent over from the office, states they couldn't find a heartbeat. Is feeling movement. No bleeding or LOF.  Onset of complaint: today  Pain score: 0/10    Lab orders placed from triage: none

## 2019-06-18 NOTE — MAU Provider Note (Signed)
Chief Complaint:  Monitoring   First Provider Initiated Contact with Patient 06/18/19 1900      HPI: Cheryl Davila is a 24 y.o. G1P0 at [redacted]w[redacted]d by LMP who presents to maternity admissions sent from the office for no fetal heart tones heard. She denies any pain or other problems. She had visit in the office today and doppler was used without obtaining FHT.  No bedside US was available in the office so pt sent to MAU for further evaluation of fetal status. She reports good fetal movement, denies LOF, vaginal bleeding, vaginal itching/burning, urinary symptoms, h/a, dizziness, n/v, or fever/chills.    HPI  Past Medical History: History reviewed. No pertinent past medical history.  Past obstetric history: OB History  Gravida Para Term Preterm AB Living  1            SAB TAB Ectopic Multiple Live Births               # Outcome Date GA Lbr Len/2nd Weight Sex Delivery Anes PTL Lv  1 Current             Past Surgical History: History reviewed. No pertinent surgical history.  Family History: Family History  Problem Relation Age of Onset  . Depression Mother   . Diabetes Maternal Grandmother     Social History: Social History   Tobacco Use  . Smoking status: Never Smoker  . Smokeless tobacco: Never Used  Substance Use Topics  . Alcohol use: No  . Drug use: No    Allergies: No Known Allergies  Meds:  Medications Prior to Admission  Medication Sig Dispense Refill Last Dose  . Prenatal Vit-Fe Fumarate-FA (M-NATAL PLUS) 27-1 MG TABS Take 1 tablet by mouth daily.   06/17/2019 at Unknown time  . Blood Pressure Monitoring (BLOOD PRESSURE MONITOR AUTOMAT) DEVI 1 Device by Does not apply route daily. Automatic blood pressure cuff regular size. To monitor blood pressure regularly at home. ICD-10 code: O09.90 1 each 0   . Misc. Devices (GOJJI WEIGHT SCALE) MISC 1 Device by Does not apply route daily as needed. To weight self daily as needed at home. ICD-10 code: O09.90 1 each 0     ROS:   Review of Systems  Constitutional: Negative for chills, fatigue and fever.  Eyes: Negative for visual disturbance.  Respiratory: Negative for shortness of breath.   Cardiovascular: Negative for chest pain.  Gastrointestinal: Negative for abdominal pain, nausea and vomiting.  Genitourinary: Negative for difficulty urinating, dysuria, flank pain, pelvic pain, vaginal bleeding, vaginal discharge and vaginal pain.  Neurological: Negative for dizziness and headaches.  Psychiatric/Behavioral: Negative.      I have reviewed patient's Past Medical Hx, Surgical Hx, Family Hx, Social Hx, medications and allergies.   Physical Exam   Patient Vitals for the past 24 hrs:  BP Temp Temp src Pulse Resp SpO2  06/18/19 1913 109/65 98.4 F (36.9 C) Oral 72 17 100 %   Constitutional: Well-developed, well-nourished female in no acute distress.  Cardiovascular: normal rate Respiratory: normal effort GI: Abd soft, non-tender, gravid appropriate for gestational age.  MS: Extremities nontender, no edema, normal ROM Neurologic: Alert and oriented x 4.  GU: Neg CVAT.  PELVIC EXAM: Deferred     FHT: Unable to obtain FHT with monitor, possibly audible FHT 150s but then doubling/halving and no continuous tracing available.     Labs: No results found for this or any previous visit (from the past 24 hour(s)). O/Positive/-- (02/24 1617)  Imaging:  Bedside US done by Sharen CounterLisa Leftwich-Kirby, CNM, indicates fetal arrhythmia, with subjectively normal amniotic fluid and fetal movement  Pt informed that the ultrasound is considered a limited OB ultrasound and is not intended to be a complete ultrasound exam.  Patient also informed that the ultrasound is not being completed with the intent of assessing for fetal or placental anomalies or any pelvic abnormalities.  Explained that the purpose of today's ultrasound is to assess for  viability.  Patient acknowledges the purpose of the exam and the limitations of the  study.    US MFM Follow up US with noted fetal arrythmia with FHR 84 Final report pending    US MFM OB COMP + 14 WK  Result Date: 05/21/2019 ----------------------------------------------------------------------  OBSTETRICS REPORT                    (Corrected Final 05/21/2019 02:23 pm) ---------------------------------------------------------------------- Patient Info  ID #:       [id number]                          D.O.B.:  [birth year] (23 yrs)  Name:       Cheryl Davila                    Visit Date: 05/21/2019 01:21 pm ---------------------------------------------------------------------- Performed By  Performed By:     Earley BrookeNicole S Dalrymple     Referred By:      [facility]                    BS, RDMS  Attending:        Lin Landsmanorenthian Booker      [facility]                    MD                                       [facility] ---------------------------------------------------------------------- Orders   #  Description                          Code         Ordered By   1  US MFM OB COMP + 14 WK               76[address].01     VERONICA ROGERS  ----------------------------------------------------------------------   #  Order #                    Accession #                 Episode #   1  [id number]                  [id number]                  [id number]  ---------------------------------------------------------------------- Indications   Encounter for antenatal screening for          Z36.3   malformations (LOW risk NIPS, NEG   [place])   [redacted] weeks gestation of pregnancy                Z3A.18  ---------------------------------------------------------------------- Fetal Evaluation  Num Of Fetuses:         1  Fetal Heart Rate(bpm):  162  Cardiac Activity:       Observed  Presentation:           Cephalic  Placenta:               Posterior  P. Cord Insertion:      Visualized, central  Amniotic Fluid  AFI FV:      Within normal limits                              Largest Pocket(cm)                               5.08 ---------------------------------------------------------------------- Biometry  BPD:        43  mm     G. Age:  19w 0d         64  %    CI:        73.63   %    70 - 86                                                          FL/HC:      16.6   %    16.1 - 18.3  HC:      159.2  mm     G. Age:  18w 5d         45  %    HC/AC:      1.17        1.09 - 1.39  AC:      136.6  mm     G. Age:  19w 1d         60  %    FL/BPD:     61.6   %  FL:       26.5  mm     G. Age:  18w 0d         21  %    FL/AC:      19.4   %    20 - 24  HUM:      27.5  mm     G. Age:  18w 6d         54  %  CER:      19.7  mm     G. Age:  18w 6d         55  %  NFT:       2.6  mm  CM:        3.9  mm  Est. FW:     250  gm      0 lb 9 oz     41  % ---------------------------------------------------------------------- OB History  Gravidity:    1         Term:   0        Prem:   0        SAB:   0  TOP:          0       Ectopic:  0        Living: 0 ---------------------------------------------------------------------- Gestational Age  U/S Today:     18w 5d  EDD:   10/17/19  Best:          18w 5d     Det. By:  U/S (05/21/19)           EDD:   10/17/19 ---------------------------------------------------------------------- Anatomy  Cranium:               Appears normal         LVOT:                   Appears normal  Cavum:                 Appears normal         Aortic Arch:            Appears normal  Ventricles:            Appears normal         Ductal Arch:            Appears normal  Choroid Plexus:        Appears normal         Diaphragm:              Appears normal  Cerebellum:            Appears normal         Stomach:                Appears normal, left                                                                        sided  Posterior Fossa:       Appears normal         Abdomen:                Appears normal  Nuchal Fold:           Appears normal         Abdominal Wall:         Appears nml (cord                                                                         insert, abd wall)  Face:                  Appears normal         Cord Vessels:           Appears normal (3                         (orbits and profile)                           vessel cord)  Lips:                  Appears normal         Kidneys:  Appear normal  Palate:                Appears normal         Bladder:                Appears normal  Thoracic:              Appears normal         Spine:                  Appears normal  Heart:                 Appears normal         Upper Extremities:      Appears normal                         (4CH, axis, and                         situs)  RVOT:                  Appears normal         Lower Extremities:      Appears normal  Other:  Fetus appears to be female. Heels and 5th digit visualized.          Technically difficult due to fetal position. ---------------------------------------------------------------------- Cervix Uterus Adnexa  Cervix  Length:           3.67  cm.  Normal appearance by transabdominal scan.  Left Ovary  Within normal limits.  Right Ovary  Not visualized.  Adnexa  No abnormality visualized. ---------------------------------------------------------------------- Impression  Normal anatomy  Uncertain LMP therefore Ms. Cheyney is dated by today's  examination  We observed good fetal movement and amniotic fluid  Low risk NIPS result.  She has a positive antibody screen and reactive RPR.  No markers of aneuploidy are observed. ---------------------------------------------------------------------- Recommendations   Follow up as clinically indicated. ----------------------------------------------------------------------                    Lin Landsman, MD Electronically Signed Corrected Final Report  05/21/2019 02:23 pm ----------------------------------------------------------------------   MAU Course/MDM: Orders Placed This Encounter  Procedures  . [place] MFM OB Limited   . [place] MFM OB FOLLOW UP  . Discharge patient    No orders of the defined types were placed in this encounter.    Unable to obtain NST but bedside US with normal fetal movement and visible but irregular FHR Pt reports normal fetal movement Consult Dr Jeanann Lewandowsky with presentation, exam findings and test results.  MFM Limited OB US with similar findings, fetal arrhythmia with bradycardia noted Consult Dr Grace Bushy with MFM and he recommends close follow up with pediatric cardiology.  Dr Grace Bushy will call tomorrow and pt to see pediatric cardiology at available facility, [facility] or [place].  Discharge patient home  Precautions given, reasons to return to MAU Keep scheduled appts at [facility] at this time    Assessment: 1. Fetal arrhythmia affecting pregnancy, antepartum   2. Biological false positive RPR test   3. Supervision of normal first pregnancy, antepartum   4. Antepartum fetal bradycardia affecting care of mother     Plan: Discharge home Labor precautions and fetal kick counts Follow-up Information    [facility] Follow [facility].   Specialty: Maternal and Fetal Medicine Why: Dr Grace Bushy  with Maternal Fetal Medicine will call you tomorrow, 06/19/19, with planned transfer/appointment with Pediatric Cardiologist at [facility] or [place].   Contact information: [address], [place] [id number] [place] [id number] [facility] Follow up.   Specialty: Obstetrics and Gynecology Why: You may also call [phone] for nurse practitioner office in MAU, ask for [facility].  Return to MAU as needed for emergencies.  Contact information: [address] [id number] [place] [phone]         Allergies as of 06/18/2019   No Known Allergies     Medication List    TAKE these medications   Blood Pressure Monitor Automat Devi 1  Device by Does not apply route daily. Automatic blood pressure cuff regular size. To monitor blood pressure regularly at home. ICD-10 code: O09.90   Gojji Weight Scale Misc 1 Device by Does not apply route daily as needed. To weight self daily as needed at home. ICD-10 code: O09.90   M-Natal Plus 27-1 MG Tabs Take 1 tablet by mouth daily.       Fatima Blank Certified Nurse-Midwife 06/18/2019 9:38 PM

## 2019-06-18 NOTE — Progress Notes (Signed)
Subjective:  Feliza Fortenberry is a 24 y.o. G1P0 at [redacted]w[redacted]d being seen today for ongoing prenatal care.  She is currently monitored for the following issues for this low-risk pregnancy and has Supervision of normal first pregnancy, antepartum; Biological false positive RPR test; Abnormal antibody titer; and Fetal arrhythmia affecting pregnancy, antepartum on their problem list.  Patient reports no complaints.  Contractions: Not present. Vag. Bleeding: None.  Movement: Present. Denies leaking of fluid.   The following portions of the patient's history were reviewed and updated as appropriate: allergies, current medications, past family history, past medical history, past social history, past surgical history and problem list. Problem list updated.  Objective:   Vitals:   06/18/19 1635  BP: 108/71  Pulse: 80  Temp: (!) 97.4 F (36.3 C)  Weight: 167 lb 6.4 oz (75.9 kg)    Fetal Status:     Movement: Present     General:  Alert, oriented and cooperative. Patient is in no acute distress.  Skin: Skin is warm and dry. No rash noted.   Cardiovascular: Normal heart rate noted  Respiratory: Normal respiratory effort, no problems with respiration noted  Abdomen: Soft, gravid, appropriate for gestational age. Pain/Pressure: Absent     Pelvic: Vag. Bleeding: None     Cervical exam deferred        Extremities: Normal range of motion.  Edema: None  Mental Status: Normal mood and affect. Normal behavior. Normal judgment and thought content.   Urinalysis:      Assessment and Plan:  Pregnancy: G1P0 at [redacted]w[redacted]d  1. Supervision of normal first pregnancy, antepartum - AFP TETRA  2. Absent fetal heart tones -unable to obtain fetal heart tones by Doppler after multiple attempts by RN and provider; [place] non-functioning in office today so unable to visualize and obtain with [place]; patient and provider both feeling fetal movement -pt to MAU for evaluation, Sharen Counter, CNM notified in MAU  3. Abnormal  antibody titer -repeat antibody test next visit, needs outpatient cross match at [facility] lab prior to delivery so blood will be available when in labor   Preterm labor symptoms and general obstetric precautions including but not limited to vaginal bleeding, contractions, leaking of fluid and fetal movement were reviewed in detail with the patient. Please refer to After Visit Summary for other counseling recommendations.  Return in about 4 weeks (around 07/16/2019) for in-person ROB/GTT/labs.   Kayci Belleville, Odie Sera, NP'

## 2019-06-19 ENCOUNTER — Other Ambulatory Visit: Payer: Self-pay | Admitting: Advanced Practice Midwife

## 2019-06-19 ENCOUNTER — Telehealth: Payer: Self-pay | Admitting: Licensed Clinical Social Worker

## 2019-06-19 ENCOUNTER — Encounter: Payer: Self-pay | Admitting: Women's Health

## 2019-06-19 DIAGNOSIS — O368390 Maternal care for abnormalities of the fetal heart rate or rhythm, unspecified trimester, not applicable or unspecified: Secondary | ICD-10-CM | POA: Insufficient documentation

## 2019-06-19 NOTE — Telephone Encounter (Signed)
Ms. Alyla Pietila called [facility] regarding a referral to a fetal cardiologist. According to Ms. Gondek a nurse with MAU advise her someone will contact her regarding the referral. Ms. Cale called the clinic because she has not received the information regarding her referral. I sent message to CNM [place] to inquire about the pending referral.

## 2019-06-19 NOTE — Progress Notes (Signed)
Outpatient fetal echocardiogram set up today with [facility] in [place].  [facility] to call pt with appointment time at 2:30 this afternoon.  Information sent to [facility] and order placed for fetal echo.  Called pt to confirm and pt plans to come to her appointment at [facility] today.

## 2019-06-20 ENCOUNTER — Encounter (HOSPITAL_COMMUNITY): Payer: Self-pay | Admitting: Advanced Practice Midwife

## 2019-06-20 LAB — AFP TETRA
DIA Value (EIA): 174.37 pg/mL
DSR (By Age)    1 IN: 1103
Gestational Age: 23 WEEKS
MSAFP Mom: 1.49
MSAFP: 119.8 ng/mL
MSHCG: 23050 m[IU]/mL
Maternal Age At EDD: 23 yr
Osb Risk: 2809
T18 (By Age): 1:4296 {titer}
Weight: 167 [lb_av]
uE3 Value: 2.89 ng/mL

## 2019-07-08 ENCOUNTER — Telehealth: Payer: Self-pay | Admitting: General Practice

## 2019-07-08 NOTE — Telephone Encounter (Signed)
Received Mychart notification that patient has not read Mychart message in regards to appointments scheduled with [facility] on 07/16/2019.  Attempted to call pt, but no answer and was unable to leave message on VM d/t VM full.  Appointment reminders mailed to patient.

## 2019-07-09 ENCOUNTER — Encounter: Payer: Self-pay | Admitting: Pediatric Cardiology

## 2019-07-14 ENCOUNTER — Other Ambulatory Visit: Payer: Self-pay | Admitting: *Deleted

## 2019-07-14 DIAGNOSIS — Z3400 Encounter for supervision of normal first pregnancy, unspecified trimester: Secondary | ICD-10-CM

## 2019-07-16 ENCOUNTER — Other Ambulatory Visit: Payer: Medicaid Other

## 2019-07-16 ENCOUNTER — Encounter: Payer: Medicaid Other | Admitting: Obstetrics and Gynecology

## 2019-07-16 ENCOUNTER — Encounter: Payer: Medicaid Other | Admitting: Family Medicine

## 2019-07-24 ENCOUNTER — Other Ambulatory Visit: Payer: Self-pay

## 2019-07-24 DIAGNOSIS — Z3400 Encounter for supervision of normal first pregnancy, unspecified trimester: Secondary | ICD-10-CM

## 2019-07-25 ENCOUNTER — Telehealth: Payer: Self-pay | Admitting: Family Medicine

## 2019-07-25 ENCOUNTER — Other Ambulatory Visit: Payer: Self-pay

## 2019-07-25 ENCOUNTER — Other Ambulatory Visit: Payer: Self-pay | Admitting: *Deleted

## 2019-07-25 ENCOUNTER — Ambulatory Visit: Payer: Medicaid Other | Admitting: *Deleted

## 2019-07-25 ENCOUNTER — Ambulatory Visit: Payer: Medicaid Other | Attending: Maternal & Fetal Medicine

## 2019-07-25 VITALS — BP 121/72 | HR 80 | Ht 67.0 in

## 2019-07-25 DIAGNOSIS — Z3400 Encounter for supervision of normal first pregnancy, unspecified trimester: Secondary | ICD-10-CM

## 2019-07-25 DIAGNOSIS — Z3A28 28 weeks gestation of pregnancy: Secondary | ICD-10-CM | POA: Diagnosis not present

## 2019-07-25 DIAGNOSIS — O368390 Maternal care for abnormalities of the fetal heart rate or rhythm, unspecified trimester, not applicable or unspecified: Secondary | ICD-10-CM

## 2019-07-25 DIAGNOSIS — R768 Other specified abnormal immunological findings in serum: Secondary | ICD-10-CM

## 2019-07-25 DIAGNOSIS — O35BXX0 Maternal care for other (suspected) fetal abnormality and damage, fetal cardiac anomalies, not applicable or unspecified: Secondary | ICD-10-CM

## 2019-07-25 DIAGNOSIS — O358XX0 Maternal care for other (suspected) fetal abnormality and damage, not applicable or unspecified: Secondary | ICD-10-CM | POA: Insufficient documentation

## 2019-07-25 DIAGNOSIS — Z362 Encounter for other antenatal screening follow-up: Secondary | ICD-10-CM | POA: Diagnosis not present

## 2019-07-25 DIAGNOSIS — O368330 Maternal care for abnormalities of the fetal heart rate or rhythm, third trimester, not applicable or unspecified: Secondary | ICD-10-CM

## 2019-07-25 NOTE — Telephone Encounter (Signed)
Patient stopped by the office to cancel her appointment on 6/21. Patient stated that she is already receiving care at her OB office. Patient instructed that it looks like [facility] her to our office because she is now high-risk. Patient verbalized that she is now getting prenatal care at [facility] since she became high risk. Verbalized understanding and appointment on 6/21 was canceled.

## 2019-07-29 ENCOUNTER — Telehealth: Payer: Self-pay | Admitting: Medical

## 2019-07-29 ENCOUNTER — Other Ambulatory Visit: Payer: Self-pay | Admitting: *Deleted

## 2019-07-29 DIAGNOSIS — R768 Other specified abnormal immunological findings in serum: Secondary | ICD-10-CM

## 2019-07-29 DIAGNOSIS — O0990 Supervision of high risk pregnancy, unspecified, unspecified trimester: Secondary | ICD-10-CM

## 2019-07-29 NOTE — Telephone Encounter (Signed)
Attempted to reach patient to inform her of her appointment we have scheduled in our office.

## 2019-07-30 ENCOUNTER — Ambulatory Visit: Payer: Medicaid Other | Attending: Obstetrics and Gynecology

## 2019-07-30 ENCOUNTER — Ambulatory Visit: Payer: Medicaid Other | Admitting: *Deleted

## 2019-07-30 ENCOUNTER — Other Ambulatory Visit: Payer: Medicaid Other

## 2019-07-30 ENCOUNTER — Other Ambulatory Visit: Payer: Self-pay

## 2019-07-30 ENCOUNTER — Other Ambulatory Visit: Payer: Self-pay | Admitting: *Deleted

## 2019-07-30 DIAGNOSIS — Z3400 Encounter for supervision of normal first pregnancy, unspecified trimester: Secondary | ICD-10-CM

## 2019-07-30 DIAGNOSIS — Z362 Encounter for other antenatal screening follow-up: Secondary | ICD-10-CM | POA: Diagnosis not present

## 2019-07-30 DIAGNOSIS — Z3A28 28 weeks gestation of pregnancy: Secondary | ICD-10-CM | POA: Diagnosis not present

## 2019-07-30 DIAGNOSIS — O368330 Maternal care for abnormalities of the fetal heart rate or rhythm, third trimester, not applicable or unspecified: Secondary | ICD-10-CM

## 2019-07-30 DIAGNOSIS — O35BXX0 Maternal care for other (suspected) fetal abnormality and damage, fetal cardiac anomalies, not applicable or unspecified: Secondary | ICD-10-CM

## 2019-07-30 DIAGNOSIS — O368390 Maternal care for abnormalities of the fetal heart rate or rhythm, unspecified trimester, not applicable or unspecified: Secondary | ICD-10-CM

## 2019-07-30 DIAGNOSIS — R768 Other specified abnormal immunological findings in serum: Secondary | ICD-10-CM

## 2019-07-30 DIAGNOSIS — O0990 Supervision of high risk pregnancy, unspecified, unspecified trimester: Secondary | ICD-10-CM

## 2019-08-08 ENCOUNTER — Other Ambulatory Visit: Payer: Medicaid Other

## 2019-08-11 ENCOUNTER — Encounter: Payer: Medicaid Other | Admitting: Family Medicine

## 2019-08-15 ENCOUNTER — Ambulatory Visit: Payer: Medicaid Other | Attending: Obstetrics and Gynecology

## 2019-08-15 ENCOUNTER — Ambulatory Visit (INDEPENDENT_AMBULATORY_CARE_PROVIDER_SITE_OTHER): Payer: Medicaid Other | Admitting: Obstetrics & Gynecology

## 2019-08-15 ENCOUNTER — Ambulatory Visit: Payer: Medicaid Other | Admitting: *Deleted

## 2019-08-15 ENCOUNTER — Other Ambulatory Visit: Payer: Self-pay

## 2019-08-15 VITALS — BP 113/75 | HR 92 | Wt 184.6 lb

## 2019-08-15 DIAGNOSIS — Z362 Encounter for other antenatal screening follow-up: Secondary | ICD-10-CM

## 2019-08-15 DIAGNOSIS — O368390 Maternal care for abnormalities of the fetal heart rate or rhythm, unspecified trimester, not applicable or unspecified: Secondary | ICD-10-CM | POA: Insufficient documentation

## 2019-08-15 DIAGNOSIS — O35BXX0 Maternal care for other (suspected) fetal abnormality and damage, fetal cardiac anomalies, not applicable or unspecified: Secondary | ICD-10-CM

## 2019-08-15 DIAGNOSIS — O368333 Maternal care for abnormalities of the fetal heart rate or rhythm, third trimester, fetus 3: Secondary | ICD-10-CM | POA: Diagnosis not present

## 2019-08-15 DIAGNOSIS — O358XX0 Maternal care for other (suspected) fetal abnormality and damage, not applicable or unspecified: Secondary | ICD-10-CM

## 2019-08-15 DIAGNOSIS — Z3400 Encounter for supervision of normal first pregnancy, unspecified trimester: Secondary | ICD-10-CM | POA: Insufficient documentation

## 2019-08-15 DIAGNOSIS — R760 Raised antibody titer: Secondary | ICD-10-CM

## 2019-08-15 DIAGNOSIS — Z3A31 31 weeks gestation of pregnancy: Secondary | ICD-10-CM

## 2019-08-15 DIAGNOSIS — O0990 Supervision of high risk pregnancy, unspecified, unspecified trimester: Secondary | ICD-10-CM

## 2019-08-15 DIAGNOSIS — O368393 Maternal care for abnormalities of the fetal heart rate or rhythm, unspecified trimester, fetus 3: Secondary | ICD-10-CM

## 2019-08-15 DIAGNOSIS — O368330 Maternal care for abnormalities of the fetal heart rate or rhythm, third trimester, not applicable or unspecified: Secondary | ICD-10-CM

## 2019-08-15 DIAGNOSIS — R768 Other specified abnormal immunological findings in serum: Secondary | ICD-10-CM | POA: Diagnosis present

## 2019-08-15 DIAGNOSIS — O0993 Supervision of high risk pregnancy, unspecified, third trimester: Secondary | ICD-10-CM

## 2019-08-15 NOTE — Progress Notes (Signed)
   PRENATAL VISIT NOTE  Subjective:  Cheryl Davila is a 24 y.o. G1P0 at [redacted]w[redacted]d being seen today for ongoing prenatal care.  She is currently monitored for the following issues for this high-risk pregnancy and has Supervision of normal first pregnancy, antepartum; Biological false positive RPR test; Abnormal antibody titer; and Fetal arrhythmia affecting pregnancy, antepartum on their problem list.  Patient reports no significant complaints.  Contractions: Not present. Vag. Bleeding: None.  Movement: Present. Denies leaking of fluid.   The following portions of the patient's history were reviewed and updated as appropriate: allergies, current medications, past family history, past medical history, past social history, past surgical history and problem list.   Objective:   Vitals:   08/15/19 0848  BP: 113/75  Pulse: 92  Weight: 184 lb 9.6 oz (83.7 kg)    Fetal Status:     Movement: Present     General:  Alert, oriented and cooperative. Patient is in no acute distress.  Skin: Skin is warm and dry. No rash noted.   Cardiovascular: Normal heart rate noted  Respiratory: Normal respiratory effort, no problems with respiration noted  Abdomen: Soft, gravid, appropriate for gestational age.  Pain/Pressure: Present     Pelvic: Cervical exam deferred        Extremities: Normal range of motion.  Edema: None  Mental Status: Normal mood and affect. Normal behavior. Normal judgment and thought content.   Assessment and Plan:  Pregnancy: G1P0 at [redacted]w[redacted]d 1. Fetal arrhythmia affecting pregnancy, antepartum 2. Fetal bradycardia, antepartum condition or complication 3. Fetal heart block affecting care of mother Scan at [facility] today.  Already set up for future appointments and plans to deliver at [facility].   4. Abnormal antibody titer - Antibody screen  5. [redacted] weeks gestation of pregnancy 6. Supervision of high risk pregnancy, antepartum Third trimester labs done today. - Glucose Tolerance, 2 Hours w/1  Hour - RPR - HIV Antibody (routine testing w rflx) - CBC  Preterm labor symptoms and general obstetric precautions including but not limited to vaginal bleeding, contractions, leaking of fluid and fetal movement were reviewed in detail with the patient. Please refer to After Visit Summary for other counseling recommendations.   No follow-ups on file.  Future Appointments  Date Time Provider Department Center  08/15/2019  1:45 PM [facility] NURSE [facility]  08/15/2019  1:45 PM [facility] US5 [facility]  08/22/2019  9:45 AM [facility] NURSE [facility]  08/22/2019  9:45 AM [facility] US4 [facility]    Jaynie Collins, MD

## 2019-08-15 NOTE — Patient Instructions (Signed)
Return to office for any scheduled appointments. Call the office or go to the MAU at [facility] if:  You begin to have strong, frequent contractions  Your water breaks.  Sometimes it is a big gush of fluid, sometimes it is just a trickle that keeps getting your panties wet or running down your legs  You have vaginal bleeding.  It is normal to have a small amount of spotting if your cervix was checked.   You do not feel your baby moving like normal.  If you do not, get something to eat and drink and lay down and focus on feeling your baby move.   If your baby is still not moving like normal, you should call the office or go to MAU.  Any other obstetric concerns.   

## 2019-08-15 NOTE — Progress Notes (Signed)
States here for lab work only arranged by [facility] - has prenatal care in [place].  Has been getting [place] with [facility] due to baby with heart block. Cheryl Davila

## 2019-08-16 LAB — ANTIBODY SCREEN: Antibody Screen: NEGATIVE

## 2019-08-19 LAB — RPR, QUANT+TP ABS (REFLEX)
Rapid Plasma Reagin, Quant: 1:1 {titer} — ABNORMAL HIGH
T Pallidum Abs: NONREACTIVE

## 2019-08-19 LAB — CBC
Hematocrit: 33.1 % — ABNORMAL LOW (ref 34.0–46.6)
Hemoglobin: 10.6 g/dL — ABNORMAL LOW (ref 11.1–15.9)
MCH: 25.9 pg — ABNORMAL LOW (ref 26.6–33.0)
MCHC: 32 g/dL (ref 31.5–35.7)
MCV: 81 fL (ref 79–97)
Platelets: 275 10*3/uL (ref 150–450)
RBC: 4.1 x10E6/uL (ref 3.77–5.28)
RDW: 13.5 % (ref 11.7–15.4)
WBC: 12.3 10*3/uL — ABNORMAL HIGH (ref 3.4–10.8)

## 2019-08-19 LAB — GLUCOSE TOLERANCE, 2 HOURS W/ 1HR
Glucose, 1 hour: 148 mg/dL (ref 65–179)
Glucose, 2 hour: 140 mg/dL (ref 65–152)
Glucose, Fasting: 74 mg/dL (ref 65–91)

## 2019-08-19 LAB — HIV ANTIBODY (ROUTINE TESTING W REFLEX): HIV Screen 4th Generation wRfx: NONREACTIVE

## 2019-08-19 LAB — RPR: RPR Ser Ql: REACTIVE — AB

## 2019-08-22 ENCOUNTER — Ambulatory Visit: Payer: Medicaid Other | Admitting: *Deleted

## 2019-08-22 ENCOUNTER — Other Ambulatory Visit: Payer: Self-pay

## 2019-08-22 ENCOUNTER — Ambulatory Visit: Payer: Medicaid Other | Attending: Obstetrics and Gynecology

## 2019-08-22 DIAGNOSIS — R768 Other specified abnormal immunological findings in serum: Secondary | ICD-10-CM

## 2019-08-22 DIAGNOSIS — Z3400 Encounter for supervision of normal first pregnancy, unspecified trimester: Secondary | ICD-10-CM | POA: Insufficient documentation

## 2019-08-22 DIAGNOSIS — O368330 Maternal care for abnormalities of the fetal heart rate or rhythm, third trimester, not applicable or unspecified: Secondary | ICD-10-CM

## 2019-08-22 DIAGNOSIS — O368390 Maternal care for abnormalities of the fetal heart rate or rhythm, unspecified trimester, not applicable or unspecified: Secondary | ICD-10-CM | POA: Insufficient documentation

## 2019-08-22 DIAGNOSIS — Z362 Encounter for other antenatal screening follow-up: Secondary | ICD-10-CM

## 2019-08-22 DIAGNOSIS — Z3A32 32 weeks gestation of pregnancy: Secondary | ICD-10-CM

## 2021-02-20 NOTE — L&D Delivery Note (Signed)
OB/GYN Faculty Practice Delivery Note  Cheryl Davila is a 26 y.o. G2P1001 s/p SVD at [redacted]w[redacted]d. She was admitted for IOL for SLE.   ROM: 8h 37m with Moderate meconium  fluid GBS Status:  Negative/-- (10/18 1452) Maximum Maternal Temperature:  Temp (24hrs), Avg:98.5 F (36.9 C), Min:97.3 F (36.3 C), Max:100.5 F (38.1 C)    Labor Progress: Patient arrived at 2.5 cm dilation and was induced with Pitocin and AROM .   Delivery Date/Time: 01/08/2022 at 0207 Delivery: Called to room and patient was complete and pushing. Head delivered in LOA position. Nuchal cord present and delivered through. Shoulder and body delivered in usual fashion. Infant with spontaneous cry, placed on mother's abdomen, dried and stimulated. Cord clamped x 2 after 1-minute delay, and cut by FOB. Cord blood drawn. Placenta delivered spontaneously with gentle cord traction. Fundus firm with massage and Pitocin. Labia, perineum, vagina, and cervix inspected with 2nd degree perineal laceration repaired with 3-0 Vicryl suture.   Placenta: Spontaneous, intact, 3 vessel cord Complications: None Lacerations: 2nd degree perineal  EBL: 138 Analgesia: Epidural    Infant: APGAR (1 MIN):  8 APGAR (5 MINS):  9 APGAR (10 MINS):    Weight: Pending   Derrel Nip, MD  OB Fellow  01/08/2022 2:41 AM

## 2021-04-18 NOTE — Progress Notes (Signed)
Office Visit Note  Patient: Cheryl Davila             Date of Birth: 08-29-1995           MRN: [id number]             PCP: Serafina Mitchell, MD Referring: Nicholes Rough, PA-C Visit Date: 04/29/2021 Occupation: Teaches seventh grade  Subjective:  Fatigue and joint pain  History of Present Illness: Cheryl Davila is a 26 y.o. female seen in consultation per request of her PCP.  According the patient she got pregnant in December 2020.  In 2021 during the ultrasound examination complete fetal heart block was detected.  Patient was placed on hydroxychloroquine and was also evaluated at [facility] rheumatology.  At the time she did not have any symptoms.  She stayed on hydroxychloroquine until the baby was born in August 2021.  In October 2021 patient started experiencing increased pain all over and episodic burning sensation in her chest with deep breaths.  She also has been experiencing episodic increased fatigue.  She had no insurance for a while and could not see any physicians.  She went to see her PCP this year after getting the insurance.  And she was referred here for further evaluation.  She continues to have fatigue.  She experiences constant achiness in her hands.  She denies any history of oral ulcers, nasal ulcers, malar rash, photosensitivity, sicca symptoms, Raynaud's phenomenon, lymphadenopathy or inflammatory arthritis.  She is gravida 1, para 1, miscarriages 0.  There is no family history of autoimmune disease.  Activities of Daily Living:  Patient reports morning stiffness for 0 minutes.   Patient Denies nocturnal pain.  Difficulty dressing/grooming: Denies Difficulty climbing stairs: Denies Difficulty getting out of chair: Denies Difficulty using hands for taps, buttons, cutlery, and/or writing: Reports  Review of Systems  Constitutional:  Positive for fatigue.  HENT:  Positive for mouth dryness. Negative for mouth sores and nose dryness.   Eyes:  Negative for pain, itching and  dryness.  Respiratory:  Negative for shortness of breath and difficulty breathing.   Cardiovascular:  Negative for chest pain and palpitations.  Gastrointestinal:  Negative for blood in stool, constipation and diarrhea.  Endocrine: Negative for increased urination.  Genitourinary:  Negative for difficulty urinating.  Musculoskeletal:  Positive for joint pain, joint pain and joint swelling. Negative for myalgias, morning stiffness, muscle tenderness and myalgias.  Skin:  Negative for color change, rash, redness and sensitivity to sunlight.  Allergic/Immunologic: Negative for susceptible to infections.  Neurological:  Positive for numbness and headaches. Negative for dizziness.  Hematological:  Negative for bruising/bleeding tendency and swollen glands.  Psychiatric/Behavioral:  Negative for depressed mood and sleep disturbance. The patient is not nervous/anxious.    PMFS History:  Patient Active Problem List   Diagnosis Date Noted   Fetal arrhythmia affecting pregnancy, antepartum 06/19/2019   Abnormal antibody titer 06/18/2019   Biological false positive RPR test 04/23/2019   Supervision of normal first pregnancy, antepartum 04/01/2019    History reviewed. No pertinent past medical history.  Family History  Problem Relation Age of Onset   Depression Mother    Healthy Sister    Healthy Sister    Healthy Brother    Healthy Brother    Healthy Brother    Diabetes Maternal Grandmother    Congenital heart disease Daughter        pacemaker   Past Surgical History:  Procedure Laterality Date   CESAREAN SECTION  2021  Social History   Social History Narrative   Not on file    There is no immunization history on file for this patient.   Objective: Vital Signs: BP 118/73 (BP Location: Right Arm, Patient Position: Sitting, Cuff Size: Normal)    Pulse 67    Ht 5\' 6"  (1.676 m)    Wt 180 lb (81.6 kg)    BMI 29.05 kg/m    Physical Exam Vitals and nursing note reviewed.   Constitutional:      Appearance: She is well-developed.  HENT:     Head: Normocephalic and atraumatic.  Eyes:     Conjunctiva/sclera: Conjunctivae normal.  Cardiovascular:     Rate and Rhythm: Normal rate and regular rhythm.     Heart sounds: Normal heart sounds.  Pulmonary:     Effort: Pulmonary effort is normal.     Breath sounds: Normal breath sounds.  Abdominal:     General: Bowel sounds are normal.     Palpations: Abdomen is soft.  Musculoskeletal:     Cervical back: Normal range of motion.  Lymphadenopathy:     Cervical: No cervical adenopathy.  Skin:    General: Skin is warm and dry.     Capillary Refill: Capillary refill takes less than 2 seconds.  Neurological:     Mental Status: She is alert and oriented to person, place, and time.  Psychiatric:        Behavior: Behavior normal.     Musculoskeletal Exam: C-spine thoracic and lumbar spine with good range of motion.  Shoulder joints, elbow joints, wrist joints, MCPs PIPs and DIPs with good range of motion with no synovitis.  She had no tenderness over MCPs or PIPs.  Hip joints, knee joints, ankles, MTPs and PIPs with good range of motion with no synovitis.  CDAI Exam: CDAI Score: -- Patient Global: --; Provider Global: -- Swollen: --; Tender: -- Joint Exam 04/29/2021   No joint exam has been documented for this visit   There is currently no information documented on the homunculus. Go to the Rheumatology activity and complete the homunculus joint exam.  Investigation: No additional findings.  Imaging: No results found.  Recent Labs: Lab Results  Component Value Date   WBC 12.3 (H) 08/15/2019   HGB 10.6 (L) 08/15/2019   PLT 275 08/15/2019    Speciality Comments: No specialty comments available.  Procedures:  No procedures performed Allergies: Patient has no known allergies.   Assessment / Plan:     Visit Diagnoses: Positive ANA (antinuclear antibody) - ANA positive, LA not detected, C4 low-12, C3  148. Dr. Marco Collie time eval on 09/08/19. -Patient states that she her first newborn developed fetal complete heart block in 2021.  At that time she was referred to [facility] rheumatology and was evaluated by Dr. Sedalia Muta.  She was placed on hydroxychloroquine.  Patient was kept on hydroxychloroquine until the C-section was performed in August 2021.  She states that hydroxychloroquine was discontinued as as she was asymptomatic.  She also lost her insurance and did not follow-up with the rheumatologist.  She states that since October 2021 she has been experiencing increased fatigue, pain in her hands and intermittent chest tightness when she breathes.  She recently got insurance and is started seeing her PCP.  She denies history of oral ulcers, nasal ulcers, sicca symptoms, malar rash, photosensitivity, Raynaud's phenomenon, lymphadenopathy, inflammatory arthritis.  There is no history of miscarriages or DVTs.  There is no known family history of autoimmune disease.  I  will obtain following labs today.  I also advised her to schedule an appointment with GYN regarding possible use of low estrogen oral contraceptive pills or IUD.  They are currently using only condoms.  Use of sunscreen on a regular basis was also emphasized.  We will wait until lab results are available for further recommendations.  I will obtain following labs today.  Plan: Protein / creatinine ratio, urine, Urinalysis, Routine w reflex microscopic, Sedimentation rate, ANA, Anti-scleroderma antibody, RNP Antibody, Anti-Smith antibody, Sjogrens syndrome-A extractable nuclear antibody, Sjogrens syndrome-B extractable nuclear antibody, Anti-DNA antibody, double-stranded, C3 and C4, Beta-2 glycoprotein antibodies, Cardiolipin antibodies, IgG, IgM, IgA, Lupus Anticoagulant Eval w/Reflex  Pain in both hands -she complains of pain and discomfort in her bilateral hands.  She has not noticed any joint swelling.  No synovitis was noted on the examination.  Plan:  Rheumatoid factor, Cyclic citrul peptide antibody, IgG,  Other fatigue -has been experiencing increased fatigue since the baby has been born.  She states she does not have the energy to exercise.  Plan: CBC with Differential/Platelet, COMPLETE METABOLIC PANEL WITH GFR, CK, TSH, Glucose 6 phosphate dehydrogenase  Heart block, fetal, affecting care of mother-her baby developed intrauterine complete Heart block and has a pacemaker now.  Biological false positive RPR test  High risk medication use -according to the patient she was treated with plaquenil during the pregnancy but hydroxychloroquine was discontinued as she was asymptomatic after the baby was born.  Orders: Orders Placed This Encounter  Procedures   CBC with Differential/Platelet   COMPLETE METABOLIC PANEL WITH GFR   Protein / creatinine ratio, urine   Urinalysis, Routine w reflex microscopic   CK   TSH   Sedimentation rate   Rheumatoid factor   Cyclic citrul peptide antibody, IgG   ANA   Anti-scleroderma antibody   RNP Antibody   Anti-Smith antibody   Sjogrens syndrome-A extractable nuclear antibody   Sjogrens syndrome-B extractable nuclear antibody   Anti-DNA antibody, double-stranded   C3 and C4   Beta-2 glycoprotein antibodies   Cardiolipin antibodies, IgG, IgM, IgA   Lupus Anticoagulant Eval w/Reflex   Glucose 6 phosphate dehydrogenase   No orders of the defined types were placed in this encounter.    Follow-Up Instructions: Return for Fatigue, joint pain.   Bo Merino, MD  Note - This record has been created using Editor, commissioning.  Chart creation errors have been sought, but may not always  have been located. Such creation errors do not reflect on  the standard of medical care.

## 2021-04-29 ENCOUNTER — Other Ambulatory Visit: Payer: Self-pay

## 2021-04-29 ENCOUNTER — Encounter: Payer: Self-pay | Admitting: Rheumatology

## 2021-04-29 ENCOUNTER — Ambulatory Visit (INDEPENDENT_AMBULATORY_CARE_PROVIDER_SITE_OTHER): Payer: Medicaid Other | Admitting: Rheumatology

## 2021-04-29 VITALS — BP 118/73 | HR 67 | Ht 66.0 in | Wt 180.0 lb

## 2021-04-29 DIAGNOSIS — R768 Other specified abnormal immunological findings in serum: Secondary | ICD-10-CM

## 2021-04-29 DIAGNOSIS — M79642 Pain in left hand: Secondary | ICD-10-CM

## 2021-04-29 DIAGNOSIS — O35BXX0 Maternal care for other (suspected) fetal abnormality and damage, fetal cardiac anomalies, not applicable or unspecified: Secondary | ICD-10-CM | POA: Diagnosis not present

## 2021-04-29 DIAGNOSIS — R5383 Other fatigue: Secondary | ICD-10-CM | POA: Diagnosis not present

## 2021-04-29 DIAGNOSIS — M79641 Pain in right hand: Secondary | ICD-10-CM | POA: Diagnosis not present

## 2021-04-29 DIAGNOSIS — Q246 Congenital heart block: Secondary | ICD-10-CM

## 2021-04-29 DIAGNOSIS — Z79899 Other long term (current) drug therapy: Secondary | ICD-10-CM

## 2021-05-04 LAB — RFX DRVVT 1:1 MIX: PT, Mixing Interp: POSITIVE — AB

## 2021-05-04 LAB — RNP ANTIBODY: Ribonucleic Protein(ENA) Antibody, IgG: <1 AI

## 2021-05-04 LAB — ANTI-SCLERODERMA ANTIBODY: Scleroderma (Scl-70) (ENA) Antibody, IgG: <1 AI

## 2021-05-04 LAB — CBC WITH DIFFERENTIAL/PLATELET
Absolute Monocytes: 482 cells/uL (ref 200–950)
Basophils Absolute: 58 cells/uL (ref 0–200)
Basophils Relative: 0.8 %
Eosinophils Absolute: 58 cells/uL (ref 15–500)
Eosinophils Relative: 0.8 %
HCT: 35.1 % (ref 35.0–45.0)
Hemoglobin: 10.7 g/dL — ABNORMAL LOW (ref 11.7–15.5)
Lymphs Abs: 2146 cells/uL (ref 850–3900)
MCH: 22.7 pg — ABNORMAL LOW (ref 27.0–33.0)
MCHC: 30.5 g/dL — ABNORMAL LOW (ref 32.0–36.0)
MCV: 74.5 fL — ABNORMAL LOW (ref 80.0–100.0)
MPV: 12.8 fL — ABNORMAL HIGH (ref 7.5–12.5)
Monocytes Relative: 6.7 %
Neutro Abs: 4457 cells/uL (ref 1500–7800)
Neutrophils Relative %: 61.9 %
Platelets: 305 10*3/uL (ref 140–400)
RBC: 4.71 10*6/uL (ref 3.80–5.10)
RDW: 18.2 % — ABNORMAL HIGH (ref 11.0–15.0)
Total Lymphocyte: 29.8 %
WBC: 7.2 10*3/uL (ref 3.8–10.8)

## 2021-05-04 LAB — PROTEIN / CREATININE RATIO, URINE
Creatinine, Urine: 70 mg/dL (ref 20–275)
Protein/Creat Ratio: 71 mg/g creat (ref 24–184)
Protein/Creatinine Ratio: 0.071 mg/mg creat (ref 0.024–0.184)
Total Protein, Urine: 5 mg/dL (ref 5–24)

## 2021-05-04 LAB — COMPLETE METABOLIC PANEL WITH GFR
AG Ratio: 1.1 (calc) (ref 1.0–2.5)
ALT: 12 U/L (ref 6–29)
AST: 14 U/L (ref 10–30)
Albumin: 4.3 g/dL (ref 3.6–5.1)
Alkaline phosphatase (APISO): 72 U/L (ref 31–125)
BUN: 9 mg/dL (ref 7–25)
CO2: 24 mmol/L (ref 20–32)
Calcium: 9.6 mg/dL (ref 8.6–10.2)
Chloride: 104 mmol/L (ref 98–110)
Creat: 0.66 mg/dL (ref 0.50–0.96)
Globulin: 4 g/dL (calc) — ABNORMAL HIGH (ref 1.9–3.7)
Glucose, Bld: 114 mg/dL — ABNORMAL HIGH (ref 65–99)
Potassium: 3.8 mmol/L (ref 3.5–5.3)
Sodium: 136 mmol/L (ref 135–146)
Total Bilirubin: 0.4 mg/dL (ref 0.2–1.2)
Total Protein: 8.3 g/dL — ABNORMAL HIGH (ref 6.1–8.1)
eGFR: 126 mL/min/{1.73_m2} (ref >=60)

## 2021-05-04 LAB — URINALYSIS, ROUTINE W REFLEX MICROSCOPIC
Bilirubin Urine: NEGATIVE
Glucose, UA: NEGATIVE
Hgb urine dipstick: NEGATIVE
Hyaline Cast: NONE SEEN /LPF
Ketones, ur: NEGATIVE
Nitrite: NEGATIVE
Protein, ur: NEGATIVE
RBC / HPF: NONE SEEN /HPF (ref 0–2)
Specific Gravity, Urine: 1.01 (ref 1.001–1.035)
pH: 6 (ref 5.0–8.0)

## 2021-05-04 LAB — RHEUMATOID FACTOR: Rheumatoid fact SerPl-aCnc: 28 IU/mL — ABNORMAL HIGH (ref <14)

## 2021-05-04 LAB — MICROSCOPIC MESSAGE

## 2021-05-04 LAB — C3 AND C4
C3 Complement: 145 mg/dL (ref 83–193)
C4 Complement: 15 mg/dL (ref 15–57)

## 2021-05-04 LAB — CYCLIC CITRUL PEPTIDE ANTIBODY, IGG: Cyclic Citrullin Peptide Ab: <16 UNITS

## 2021-05-04 LAB — SJOGRENS SYNDROME-B EXTRACTABLE NUCLEAR ANTIBODY: SSB (La) (ENA) Antibody, IgG: <1 AI

## 2021-05-04 LAB — GLUCOSE 6 PHOSPHATE DEHYDROGENASE: G-6PDH: 22.4 U/g Hgb — ABNORMAL HIGH (ref 7.0–20.5)

## 2021-05-04 LAB — ANA: Anti Nuclear Antibody (ANA): POSITIVE — AB

## 2021-05-04 LAB — LUPUS ANTICOAGULANT EVAL W/ REFLEX
PTT-LA Screen: 38 s (ref <=40)
dRVVT: 55 s — ABNORMAL HIGH (ref <=45)

## 2021-05-04 LAB — ANTI-DNA ANTIBODY, DOUBLE-STRANDED: ds DNA Ab: 4 IU/mL

## 2021-05-04 LAB — SJOGRENS SYNDROME-A EXTRACTABLE NUCLEAR ANTIBODY: SSA (Ro) (ENA) Antibody, IgG: >8 AI — AB

## 2021-05-04 LAB — CK: Total CK: 58 U/L (ref 29–143)

## 2021-05-04 LAB — BETA-2 GLYCOPROTEIN ANTIBODIES
Beta-2 Glyco 1 IgA: 8.1 U/mL (ref <20.0)
Beta-2 Glyco 1 IgM: 17.3 U/mL (ref <20.0)
Beta-2 Glyco I IgG: 5.1 U/mL (ref <20.0)

## 2021-05-04 LAB — CARDIOLIPIN ANTIBODIES, IGG, IGM, IGA
Anticardiolipin IgA: 10.2 APL-U/mL (ref <20.0)
Anticardiolipin IgG: 4.9 GPL-U/mL (ref <20.0)
Anticardiolipin IgM: 13.5 MPL-U/mL (ref <20.0)

## 2021-05-04 LAB — ANTI-SMITH ANTIBODY: ENA SM Ab Ser-aCnc: <1 AI

## 2021-05-04 LAB — TSH: TSH: 2.15 mIU/L

## 2021-05-04 LAB — ANTI-NUCLEAR AB-TITER (ANA TITER): ANA Titer 1: 1:1280 {titer} — ABNORMAL HIGH

## 2021-05-04 LAB — SEDIMENTATION RATE: Sed Rate: 28 mm/h — ABNORMAL HIGH (ref 0–20)

## 2021-05-04 LAB — RFLX DRVVT CONFRIM: DRVVT CONFIRM: POSITIVE — AB

## 2021-05-04 NOTE — Progress Notes (Signed)
I will discuss results at the follow-up visit.  Please schedule an earlier appointment if there are any openings.

## 2021-05-04 NOTE — Progress Notes (Signed)
Please keep her on a cancellation list otherwise we will discuss at the follow-up visit.

## 2021-05-16 NOTE — Progress Notes (Signed)
? ?Office Visit Note ? ?Patient: Cheryl Davila             ?Date of Birth: [birth year]           ?MRN: [id number]             ?PCP: Serafina Mitchell, MD ?Referring: Serafina Mitchell, * ?Visit Date: 05/20/2021 ?Occupation: @GUAROCC @ ? ?Subjective:  ?Joint pain, pregnancy ? ?History of Present Illness: Annamaria Fritcher is a 26 y.o. female history of systemic lupus erythematosus.  She continues to have some discomfort in her bilateral hands and bilateral knee joints.  She states she is probably about [redacted] weeks pregnant.  She missed her menstrual cycle a week ago.  She will schedule appointment with her OB.  She had blood test at her PCPs office which was positive.  She denies any history of oral ulcers, nasal ulcers.  She continues to have fatigue and dry mouth.  She denies history of shortness of breath, palpitations, Raynauds, photosensitivity or lymphadenopathy.  Sunscreen is very important for you because son's flares of lupus okay with this light can flare lupus so as soon as you get up in the morning would like 50 SPF sunscreen ? ?Activities of Daily Living:  ?Patient reports morning stiffness for 0  none .   ?Patient Denies nocturnal pain.  ?Difficulty dressing/grooming: Denies ?Difficulty climbing stairs: Denies ?Difficulty getting out of chair: Denies ?Difficulty using hands for taps, buttons, cutlery, and/or writing: Reports ? ?Review of Systems  ?Constitutional:  Positive for fatigue.  ?HENT:  Positive for mouth dryness. Negative for mouth sores.   ?Eyes:  Negative for dryness.  ?Respiratory:  Positive for shortness of breath.   ?Cardiovascular:  Negative for chest pain, palpitations and swelling in legs/feet.  ?Gastrointestinal:  Negative for constipation.  ?Endocrine: Positive for cold intolerance, heat intolerance and excessive thirst.  ?Genitourinary:  Negative for difficulty urinating.  ?Musculoskeletal:  Negative for joint pain, joint pain, myalgias, morning stiffness and myalgias.  ?Skin:  Negative  for color change, rash and sensitivity to sunlight.  ?Allergic/Immunologic: Negative for susceptible to infections.  ?Neurological:  Negative for numbness.  ?Hematological:  Negative for bruising/bleeding tendency and swollen glands.  ?Psychiatric/Behavioral:  Negative for depressed mood and sleep disturbance. The patient is not nervous/anxious.   ? ?PMFS History:  ?Patient Active Problem List  ? Diagnosis Date Noted  ? Fetal arrhythmia affecting pregnancy, antepartum 06/19/2019  ? Abnormal antibody titer 06/18/2019  ? Biological false positive RPR test 04/23/2019  ? Supervision of normal first pregnancy, antepartum 04/01/2019  ?  ?History reviewed. No pertinent past medical history.  ?Family History  ?Problem Relation Age of Onset  ? Depression Mother   ? Healthy Sister   ? Healthy Sister   ? Healthy Brother   ? Healthy Brother   ? Healthy Brother   ? Diabetes Maternal Grandmother   ? Congenital heart disease Daughter   ?     pacemaker  ? ?Past Surgical History:  ?Procedure Laterality Date  ? CESAREAN SECTION  2021  ? ?Social History  ? ?Social History Narrative  ? Not on file  ? ? ?There is no immunization history on file for this patient.  ? ?Objective: ?Vital Signs: BP 114/75 (BP Location: Left Arm, Patient Position: Sitting, Cuff Size: Normal)   Pulse 80   Resp 14   Ht 5' 7"  (1.702 m)   Wt 173 lb (78.5 kg)   BMI 27.10 kg/m?   ? ?Physical Exam ?Vitals and nursing note reviewed.  ?Constitutional:   ?  Appearance: She is well-developed.  ?HENT:  ?   Head: Normocephalic and atraumatic.  ?Eyes:  ?   Conjunctiva/sclera: Conjunctivae normal.  ?Cardiovascular:  ?   Rate and Rhythm: Normal rate and regular rhythm.  ?   Heart sounds: Normal heart sounds.  ?Pulmonary:  ?   Effort: Pulmonary effort is normal.  ?   Breath sounds: Normal breath sounds.  ?Abdominal:  ?   General: Bowel sounds are normal.  ?   Palpations: Abdomen is soft.  ?Musculoskeletal:  ?   Cervical back: Normal range of motion.  ?Lymphadenopathy:   ?   Cervical: No cervical adenopathy.  ?Skin: ?   General: Skin is warm and dry.  ?   Capillary Refill: Capillary refill takes less than 2 seconds.  ?Neurological:  ?   Mental Status: She is alert and oriented to person, place, and time.  ?Psychiatric:     ?   Behavior: Behavior normal.  ?  ? ?Musculoskeletal Exam: C-spine was in good range of motion, shoulder joints, elbow joints, wrist joints, MCPs PIPs and DIPs with good range of motion with no synovitis.  Hip joints, knee joints, ankles, MTPs and PIPs with good range of motion with no synovitis. ? ?CDAI Exam: ?CDAI Score: -- ?Patient Global: --; Provider Global: -- ?Swollen: --; Tender: -- ?Joint Exam 05/20/2021  ? ?No joint exam has been documented for this visit  ? ?There is currently no information documented on the homunculus. Go to the Rheumatology activity and complete the homunculus joint exam. ? ?Investigation: ?No additional findings. ? ?Imaging: ?No results found. ? ?Recent Labs: ?Lab Results  ?Component Value Date  ? WBC 7.2 04/29/2021  ? HGB 10.7 (L) 04/29/2021  ? PLT 305 04/29/2021  ? NA 136 04/29/2021  ? K 3.8 04/29/2021  ? CL 104 04/29/2021  ? CO2 24 04/29/2021  ? GLUCOSE 114 (H) 04/29/2021  ? BUN 9 04/29/2021  ? CREATININE 0.66 04/29/2021  ? BILITOT 0.4 04/29/2021  ? AST 14 04/29/2021  ? ALT 12 04/29/2021  ? PROT 8.3 (H) 04/29/2021  ? CALCIUM 9.6 04/29/2021  ? ? ?April 25, 2008 2023 protein creatinine ratio normal, UA negative, G6PD normal, TSH normal, CK 58, ESR 28, RF 28, anti-CCP negative, ANA 1: 1280NS, SSA positive, (SSB, double-stranded DNA, Smith, RNP, SCL 70 negative), C3-C4 normal, lupus anticoagulant positive, anticardiolipin negative, beta-2 GP 1 negative ? ?Speciality Comments: No specialty comments available. ? ?Procedures:  ?No procedures performed ?Allergies: Patient has no known allergies.  ? ?Assessment / Plan:     ?Visit Diagnoses: Other systemic lupus erythematosus with other organ involvement (HCC) - Positive ANA, positive  SSA, positive lupus anticoagulant, history of fatigue, joint pain, chest tightness, fetal congenital heart block 2021.  She continues to have fatigue and joint pain.  She skipped her last menstrual cycle a week ago.  She states her pregnancy test was positive at her PCPs office.  We had a detailed discussion regarding high risk pregnancy.  She will establish with high risk OB and also fetomaternal specialist.  Left findings were discussed with the patient at length.  She has positive ANA and positive SSA antibody which increases the risk of arrhythmias and the patient and also heart block had arrhythmias in the fetus.  She was advised to get a baseline EKG with her PCP.  We discussed the use of hydroxychloroquine in pregnancy and prevention of fetal heart arrhythmias.  She had similar discussion with Dr. Lenis Dickinson few years back with her previous pregnancy.  She was in agreement to go forward with hydroxychloroquine.  A handout was given and consent was taken.  We will start her on hydroxychloroquine 200 mg p.o. twice daily.  She was advised to get a baseline eye examination and then yearly eye examination.  We will check labs again in 3 months and will repeat lupus anticoagulant at that time.  Use of sunscreen was also discussed. ? ?Patient was counseled on the purpose, proper use, and adverse effects of hydroxychloroquine including nausea/diarrhea, skin rash, headaches, and sun sensitivity.  Advised patient to wear sunscreen once starting hydroxychloroquine to reduce risk of rash associated with sun sensitivity.  Discussed importance of annual eye exams while on hydroxychloroquine to monitor to ocular toxicity and discussed importance of frequent laboratory monitoring.  Provided patient with eye exam form for baseline ophthalmologic exam.  Reviewed risk for QTC prolongation when used in combination with other QTc prolonging agents (including but not limited to antiarrhythmics, macrolide antibiotics, flouroquinolones,  tricyclic antidepressants, citalopram, specific antipsychotics, ondansetron, migraine triptans, and methadone). Provided patient with educational materials on hydroxychloroquine and answered all questions.  Pa

## 2021-05-20 ENCOUNTER — Encounter: Payer: Self-pay | Admitting: Rheumatology

## 2021-05-20 ENCOUNTER — Ambulatory Visit (INDEPENDENT_AMBULATORY_CARE_PROVIDER_SITE_OTHER): Payer: Medicaid Other | Admitting: Rheumatology

## 2021-05-20 VITALS — BP 114/75 | HR 80 | Resp 14 | Ht 67.0 in | Wt 173.0 lb

## 2021-05-20 DIAGNOSIS — M3219 Other organ or system involvement in systemic lupus erythematosus: Secondary | ICD-10-CM

## 2021-05-20 DIAGNOSIS — Z3A01 Less than 8 weeks gestation of pregnancy: Secondary | ICD-10-CM | POA: Diagnosis not present

## 2021-05-20 DIAGNOSIS — Z79899 Other long term (current) drug therapy: Secondary | ICD-10-CM | POA: Diagnosis not present

## 2021-05-20 DIAGNOSIS — O35BXX0 Maternal care for other (suspected) fetal abnormality and damage, fetal cardiac anomalies, not applicable or unspecified: Secondary | ICD-10-CM

## 2021-05-20 DIAGNOSIS — R760 Raised antibody titer: Secondary | ICD-10-CM | POA: Diagnosis not present

## 2021-05-20 DIAGNOSIS — M79641 Pain in right hand: Secondary | ICD-10-CM

## 2021-05-20 DIAGNOSIS — M79642 Pain in left hand: Secondary | ICD-10-CM

## 2021-05-20 DIAGNOSIS — R5383 Other fatigue: Secondary | ICD-10-CM

## 2021-05-20 MED ORDER — HYDROXYCHLOROQUINE SULFATE 200 MG PO TABS: 200.0000 mg | ORAL_TABLET | Freq: Two times a day (BID) | ORAL | 0 refills | Status: DC

## 2021-05-20 NOTE — Patient Instructions (Addendum)
Hydroxychloroquine Tablets ?What is this medication? ?HYDROXYCHLOROQUINE (hye drox ee KLOR oh kwin) treats autoimmune conditions, such as rheumatoid arthritis and lupus. It works by slowing down an overactive immune system. It may also be used to prevent and treat malaria. It works by killing the parasite that causes malaria. It belongs to a group of medications called DMARDs. ?This medicine may be used for other purposes; ask your health care provider or pharmacist if you have questions. ?COMMON BRAND NAME(S): Plaquenil, Quineprox ?What should I tell my care team before I take this medication? ?They need to know if you have any of these conditions: ?Diabetes ?Eye disease, vision problems ?G6PD deficiency ?Heart disease ?History of irregular heartbeat ?If you often drink alcohol ?Kidney disease ?Liver disease ?Porphyria ?Psoriasis ?An unusual or allergic reaction to chloroquine, hydroxychloroquine, other medications, foods, dyes, or preservatives ?Pregnant or trying to get pregnant ?Breast-feeding ?How should I use this medication? ?Take this medication by mouth with a glass of water. Take it as directed on the prescription label. Do not cut, crush or chew this medication. Swallow the tablets whole. Take it with food. Do not take it more than directed. Take all of this medication unless your care team tells you to stop it early. Keep taking it even if you think you are better. ?Take products with antacids in them at a different time of day than this medication. Take this medication 4 hours before or 4 hours after antacids. Talk to your care team if you have questions. ?Talk to your care team about the use of this medication in children. While this medication may be prescribed for selected conditions, precautions do apply. ?Overdosage: If you think you have taken too much of this medicine contact a poison control center or emergency room at once. ?NOTE: This medicine is only for you. Do not share this medicine with  others. ?What if I miss a dose? ?If you miss a dose, take it as soon as you can. If it is almost time for your next dose, take only that dose. Do not take double or extra doses. ?What may interact with this medication? ?Do not take this medication with any of the following: ?Cisapride ?Dronedarone ?Pimozide ?Thioridazine ?This medication may also interact with the following: ?Ampicillin ?Antacids ?Cimetidine ?Cyclosporine ?Digoxin ?Kaolin ?Medications for diabetes, like insulin, glipizide, glyburide ?Medications for seizures like carbamazepine, phenobarbital, phenytoin ?Mefloquine ?Methotrexate ?Other medications that prolong the QT interval (cause an abnormal heart rhythm) ?Praziquantel ?This list may not describe all possible interactions. Give your health care provider a list of all the medicines, herbs, non-prescription drugs, or dietary supplements you use. Also tell them if you smoke, drink alcohol, or use illegal drugs. Some items may interact with your medicine. ?What should I watch for while using this medication? ?Visit your care team for regular checks on your progress. Tell your care team if your symptoms do not start to get better or if they get worse. ?You may need blood work done while you are taking this medication. If you take other medications that can affect heart rhythm, you may need more testing. Talk to your care team if you have questions. ?Your vision may be tested before and during use of this medication. Tell your care team right away if you have any change in your eyesight. ?This medication may cause serious skin reactions. They can happen weeks to months after starting the medication. Contact your care team right away if you notice fevers or flu-like symptoms with a rash. The   rash may be red or purple and then turn into blisters or peeling of the skin. Or, you might notice a red rash with swelling of the face, lips or lymph nodes in your neck or under your arms. ?If you or your family  notice any changes in your behavior, such as new or worsening depression, thoughts of harming yourself, anxiety, or other unusual or disturbing thoughts, or memory loss, call your care team right away. ?What side effects may I notice from receiving this medication? ?Side effects that you should report to your care team as soon as possible: ?Allergic reactions--skin rash, itching, hives, swelling of the face, lips, tongue, or throat ?Aplastic anemia--unusual weakness or fatigue, dizziness, headache, trouble breathing, increased bleeding or bruising ?Change in vision ?Heart rhythm changes--fast or irregular heartbeat, dizziness, feeling faint or lightheaded, chest pain, trouble breathing ?Infection--fever, chills, cough, or sore throat ?Low blood sugar (hypoglycemia)--tremors or shaking, anxiety, sweating, cold or clammy skin, confusion, dizziness, rapid heartbeat ?Muscle injury--unusual weakness or fatigue, muscle pain, dark yellow or brown urine, decrease in amount of urine ?Pain, tingling, or numbness in the hands or feet ?Rash, fever, and swollen lymph nodes ?Redness, blistering, peeling, or loosening of the skin, including inside the mouth ?Thoughts of suicide or self-harm, worsening mood, or feelings of depression ?Unusual bruising or bleeding ?Side effects that usually do not require medical attention (report to your care team if they continue or are bothersome): ?Diarrhea ?Headache ?Nausea ?Stomach pain ?Vomiting ?This list may not describe all possible side effects. Call your doctor for medical advice about side effects. You may report side effects to FDA at 1-800-FDA-1088. ?Where should I keep my medication? ?Keep out of the reach of children and pets. ?Store at room temperature up to 30 degrees C (86 degrees F). Protect from light. Get rid of any unused medication after the expiration date. ?To get rid of medications that are no longer needed or have expired: ?Take the medication to a medication take-back  program. Check with your pharmacy or law enforcement to find a location. ?If you cannot return the medication, check the label or package insert to see if the medication should be thrown out in the garbage or flushed down the toilet. If you are not sure, ask your care team. If it is safe to put it in the trash, empty the medication out of the container. Mix the medication with cat litter, dirt, coffee grounds, or other unwanted substance. Seal the mixture in a bag or container. Put it in the trash. ?NOTE: This sheet is a summary. It may not cover all possible information. If you have questions about this medicine, talk to your doctor, pharmacist, or health care provider. ?? 2022 [employer]vier/Gold Standard (2020-06-24 00:00:00) ?Standing Labs ?We placed an order today for your standing lab work.  ? ?Please have your standing labs drawn in 1 month after starting hydroxychloroquine, and then every 3 months  ? ?If possible, please have your labs drawn 2 weeks prior to your appointment so that the provider can discuss your results at your appointment. ? ?Please note that you may see your imaging and lab results in MyChart before we have reviewed them. ?We may be awaiting multiple results to interpret others before contacting you. ?Please allow our office up to 72 hours to thoroughly review all of the results before contacting the office for clarification of your results. ? ?We have open lab daily: ?Monday through Thursday from 1:30-4:30 PM and Friday from 1:30-4:00 PM ?at the office  of Dr. Pollyann Savoy, [facility].   ?Please be advised, all patients with office appointments requiring lab work will take precedent over walk-in lab work.  ?If possible, please come for your lab work on Monday and Friday afternoons, as you may experience shorter wait times. ?The office is located at [address], [place], [place] [address] ?No appointment is necessary.   ?Labs are drawn by [facility]. Please bring your  co-pay at the time of your lab draw.  You may receive a bill from [facility] for your lab work. ? ?Please note if you are on Hydroxychloroquine and and an order has been placed for a Hydroxychloroquine level, yo

## 2021-06-03 ENCOUNTER — Other Ambulatory Visit: Payer: Self-pay

## 2021-06-03 ENCOUNTER — Inpatient Hospital Stay (HOSPITAL_COMMUNITY)
Admission: AD | Admit: 2021-06-03 | Discharge: 2021-06-04 | Disposition: A | Discharge status: Home / Self Care | Payer: Medicaid Other | Attending: Obstetrics and Gynecology | Admitting: Obstetrics and Gynecology

## 2021-06-03 ENCOUNTER — Encounter (HOSPITAL_COMMUNITY): Payer: Self-pay | Admitting: Obstetrics and Gynecology

## 2021-06-03 DIAGNOSIS — Z3A09 9 weeks gestation of pregnancy: Secondary | ICD-10-CM | POA: Insufficient documentation

## 2021-06-03 DIAGNOSIS — O26891 Other specified pregnancy related conditions, first trimester: Secondary | ICD-10-CM | POA: Insufficient documentation

## 2021-06-03 DIAGNOSIS — Z3491 Encounter for supervision of normal pregnancy, unspecified, first trimester: Secondary | ICD-10-CM

## 2021-06-03 DIAGNOSIS — O2341 Unspecified infection of urinary tract in pregnancy, first trimester: Secondary | ICD-10-CM

## 2021-06-03 DIAGNOSIS — R109 Unspecified abdominal pain: Secondary | ICD-10-CM | POA: Insufficient documentation

## 2021-06-03 LAB — URINALYSIS, ROUTINE W REFLEX MICROSCOPIC
Bacteria, UA: NONE SEEN
Bilirubin Urine: NEGATIVE
Glucose, UA: NEGATIVE mg/dL
Hgb urine dipstick: NEGATIVE
Ketones, ur: NEGATIVE mg/dL
Nitrite: NEGATIVE
Protein, ur: NEGATIVE mg/dL
Specific Gravity, Urine: 1.018 (ref 1.005–1.030)
pH: 6 (ref 5.0–8.0)

## 2021-06-03 NOTE — [facility] Note (Addendum)
.  Cheryl Davila is a 26 y.o. at [redacted]w[redacted]d here in [facility]U reporting: sharp pain in RLQ of abdomen today. Had uti recently but used OTC med but was afraid may not have gone away. No vag bleeding.  ?LMP: 04/15/21 ?Onset of complaint: 12n today ?Pain score: 4 ?Vitals:  ? 06/03/21 2300 06/03/21 2301  ?BP:  111/75  ?Pulse: 65   ?Resp: 16   ?Temp: 97.9 ?F (36.6 ?C)   ?SpO2: 100%   ?   ?FHT:n/a ?Lab orders placed from triage: u/a  ?

## 2021-06-04 ENCOUNTER — Inpatient Hospital Stay (HOSPITAL_COMMUNITY): Payer: Medicaid Other

## 2021-06-04 DIAGNOSIS — Z3A09 9 weeks gestation of pregnancy: Secondary | ICD-10-CM

## 2021-06-04 DIAGNOSIS — R109 Unspecified abdominal pain: Secondary | ICD-10-CM | POA: Diagnosis not present

## 2021-06-04 DIAGNOSIS — O26891 Other specified pregnancy related conditions, first trimester: Secondary | ICD-10-CM

## 2021-06-04 DIAGNOSIS — O2341 Unspecified infection of urinary tract in pregnancy, first trimester: Secondary | ICD-10-CM | POA: Diagnosis not present

## 2021-06-04 LAB — WET PREP, GENITAL
Sperm: NONE SEEN
Trich, Wet Prep: NONE SEEN
WBC, Wet Prep HPF POC: >=10 — AB (ref <10)
Yeast Wet Prep HPF POC: NONE SEEN

## 2021-06-04 LAB — CBC
HCT: 33.8 % — ABNORMAL LOW (ref 36.0–46.0)
Hemoglobin: 11.1 g/dL — ABNORMAL LOW (ref 12.0–15.0)
MCH: 23.8 pg — ABNORMAL LOW (ref 26.0–34.0)
MCHC: 32.8 g/dL (ref 30.0–36.0)
MCV: 72.5 fL — ABNORMAL LOW (ref 80.0–100.0)
Platelets: 299 10*3/uL (ref 150–400)
RBC: 4.66 MIL/uL (ref 3.87–5.11)
RDW: 18 % — ABNORMAL HIGH (ref 11.5–15.5)
WBC: 9.8 10*3/uL (ref 4.0–10.5)
nRBC: 0 % (ref 0.0–0.2)

## 2021-06-04 LAB — HCG, QUANTITATIVE, PREGNANCY: hCG, Beta Chain, Quant, S: 195134 m[IU]/mL — ABNORMAL HIGH (ref <5)

## 2021-06-04 MED ORDER — CEFADROXIL 500 MG PO CAPS: 500.0000 mg | ORAL_CAPSULE | Freq: Two times a day (BID) | ORAL | 0 refills | Status: AC

## 2021-06-04 NOTE — Progress Notes (Signed)
Lisa Leftwich-Kirby CNM in earlier to discuss test results and d/c plan. Written and verbal d/c instructions given and understanding voiced. 

## 2021-06-04 NOTE — MAU Provider Note (Signed)
Chief Complaint: Abdominal Pain ? ? None  ?  ?Pain in early pregn ? ?SUBJECTIVE ?HPI: Cheryl Davila is a 26 y.o. G2P0 at 7 weeks by LMP who presents to maternity admissions reporting intermittent lower abdominal cramping pain, worse on left lower side.   ? ? ?HPI ? ?No past medical history on file. ?Past Surgical History:  ?Procedure Laterality Date  ? CESAREAN SECTION  2021  ? ?Social History  ? ?Socioeconomic History  ? Marital status: Significant Other  ?  Spouse name: Not on file  ? Number of children: Not on file  ? Years of education: Current Sr college  ? Highest education level: Some college, no degree  ?Occupational History  ?  Comment: Systems analysttudent Teacher  ?  Employer: [employer]  ?Tobacco Use  ? Smoking status: Never  ?  Passive exposure: Never  ? Smokeless tobacco: Never  ?Vaping Use  ? Vaping Use: Never used  ?Substance and Sexual Activity  ? Alcohol use: No  ? Drug use: No  ? Sexual activity: Yes  ?  Birth control/protection: None  ?Other Topics Concern  ? Not on file  ?Social History Narrative  ? Not on file  ? ?Social Determinants of Health  ? ?Financial Resource Strain: Not on file  ?Food Insecurity: Not on file  ?Transportation Needs: Not on file  ?Physical Activity: Not on file  ?Stress: Not on file  ?Social Connections: Not on file  ?Intimate Partner Violence: Not on file  ? ?No current facility-administered medications on file prior to encounter.  ? ?Current Outpatient Medications on File Prior to Encounter  ?Medication Sig Dispense Refill  ? Blood Pressure Monitoring (BLOOD PRESSURE MONITOR AUTOMAT) DEVI 1 Device by Does not apply route daily. Automatic blood pressure cuff regular size. To monitor blood pressure regularly at home. ICD-10 code: 98O09.90 (Patient not taking: Reported on 08/15/2019) 1 each 0  ? hydroxychloroquine (PLAQUENIL) 200 MG tablet Take 1 tablet (200 mg total) by mouth 2 (two) times daily. 180 tablet 0  ? Prenatal Vit-Fe Fumarate-FA (M-NATAL PLUS) 27-1 MG TABS Take 1 tablet by mouth  daily.    ? ?No Known Allergies ? ?ROS:  ?Review of Systems  ?Constitutional:  Negative for chills, fatigue and fever.  ?Respiratory:  Negative for shortness of breath.   ?Cardiovascular:  Negative for chest pain.  ?Gastrointestinal:  Positive for abdominal pain.  ?Genitourinary:  Negative for difficulty urinating, dysuria, flank pain, pelvic pain, vaginal bleeding, vaginal discharge and vaginal pain.  ?Neurological:  Negative for dizziness and headaches.  ?Psychiatric/Behavioral: Negative.    ? ? ?I have reviewed patient's Past Medical Hx, Surgical Hx, Family Hx, Social Hx, medications and allergies.  ? ?Physical Exam  ?Patient Vitals for the past 24 hrs: ? BP Temp Pulse Resp SpO2 Height Weight  ?06/03/21 2301 111/75 -- -- -- -- -- --  ?06/03/21 2300 -- 97.9 ?F (36.6 ?C) 65 16 100 % 5\' 7"  (1.702 m) 77.6 kg  ? ?Constitutional: Well-developed, well-nourished female in no acute distress.  ?Cardiovascular: normal rate ?Respiratory: normal effort ?GI: Abd soft, non-tender. Pos BS x 4 ?MS: Extremities nontender, no edema, normal ROM ?Neurologic: Alert and oriented x 4.  ?GU: Neg CVAT. ? ? ? ?LAB RESULTS ?Results for orders placed or performed during the hospital encounter of 06/03/21 (from the past 24 hour(s))  ?Urinalysis, Routine w reflex microscopic Urine, Clean Catch     Status: Abnormal  ? Collection Time: 06/03/21 11:10 PM  ?Result Value Ref Range  ? Color, Urine  YELLOW YELLOW  ? APPearance HAZY (A) CLEAR  ? Specific Gravity, Urine 1.018 1.005 - 1.030  ? pH 6.0 5.0 - 8.0  ? Glucose, UA NEGATIVE NEGATIVE mg/dL  ? Hgb urine dipstick NEGATIVE NEGATIVE  ? Bilirubin Urine NEGATIVE NEGATIVE  ? Ketones, ur NEGATIVE NEGATIVE mg/dL  ? Protein, ur NEGATIVE NEGATIVE mg/dL  ? Nitrite NEGATIVE NEGATIVE  ? Leukocytes,Ua LARGE (A) NEGATIVE  ? RBC / HPF 0-5 0 - 5 RBC/hpf  ? WBC, UA 6-10 0 - 5 WBC/hpf  ? Bacteria, UA NONE SEEN NONE SEEN  ? Squamous Epithelial / LPF 6-10 0 - 5  ? Mucus PRESENT   ?Wet prep, genital     Status:  Abnormal  ? Collection Time: 06/04/21  2:45 AM  ? Specimen: Vaginal  ?Result Value Ref Range  ? Yeast Wet Prep HPF POC NONE SEEN NONE SEEN  ? Trich, Wet Prep NONE SEEN NONE SEEN  ? Clue Cells Wet Prep HPF POC PRESENT (A) NONE SEEN  ? WBC, Wet Prep HPF POC >=10 (A) <10  ? Sperm NONE SEEN   ?CBC     Status: Abnormal  ? Collection Time: 06/04/21  3:01 AM  ?Result Value Ref Range  ? WBC 9.8 4.0 - 10.5 K/uL  ? RBC 4.66 3.87 - 5.11 MIL/uL  ? Hemoglobin 11.1 (L) 12.0 - 15.0 g/dL  ? HCT 33.8 (L) 36.0 - 46.0 %  ? MCV 72.5 (L) 80.0 - 100.0 fL  ? MCH 23.8 (L) 26.0 - 34.0 pg  ? MCHC 32.8 30.0 - 36.0 g/dL  ? RDW 18.0 (H) 11.5 - 15.5 %  ? Platelets 299 150 - 400 K/uL  ? nRBC 0.0 0.0 - 0.2 %  ?hCG, quantitative, pregnancy     Status: Abnormal  ? Collection Time: 06/04/21  3:01 AM  ?Result Value Ref Range  ? hCG, Beta Chain, Quant, S 195,134 (H) <5 mIU/mL  ? ? ?  ? ?IMAGING ?US OB Comp Less 14 Wks ? ?Result Date: 06/04/2021 ?CLINICAL DATA:  Right lower quadrant pain. EXAM: OBSTETRIC <14 WK ULTRASOUND TECHNIQUE: Transabdominal ultrasound was performed for evaluation of the gestation as well as the maternal uterus and adnexal regions. COMPARISON:  None. FINDINGS: Intrauterine gestational sac: Single Yolk sac:  Visualized. Embryo:  Visualized. Cardiac Activity: Visualized. Heart Rate: 166 bpm CRL:   28.4 mm   9 w 4 d                  [place] EDC: January 03, 2022 Subchorionic hemorrhage:  None visualized. Maternal uterus/adnexae: The right ovary is visualized and is normal in appearance. A 3.2 cm x 2.4 cm x 2.4 cm simple cyst is seen within the left ovary. No pelvic free fluid is identified. IMPRESSION: Single, viable intrauterine pregnancy at approximately 9 weeks and 4 days gestation by ultrasound evaluation. Electronically Signed   By: Aram Candela M.D.   On: 06/04/2021 03:58   ? ?MAU Management/MDM: ?Orders Placed This Encounter  ?Procedures  ? Wet prep, genital  ? US OB Comp Less 14 Wks  ? Urinalysis, Routine w reflex microscopic  Urine, Clean Catch  ? CBC  ? hCG, quantitative, pregnancy  ? Discharge patient  ?  ?No orders of the defined types were placed in this encounter. ?  ?IUP noted on today's [place], with gestational age [redacted]w[redacted]d, not consistent with LMP dates.  EDD chagned to 01/03/22 by today's [place].  Large leukocytes in UA and pt reports she was diagnosed with UTI by her  rheumatologist but was not treated except with OTC medications.  Rx for Duricef BID x 7 days sent to pharmacy.  Pt to f/u with prenatal care with [facility].  Return to MAU as needed for emergencies.   ? ?ASSESSMENT ?1. Abdominal pain during pregnancy in first trimester   ?2. [redacted] weeks gestation of pregnancy   ? ? ?PLAN ?Discharge home ?Allergies as of 06/04/2021   ?No Known Allergies ?  ? ?  ?Medication List  ?  ? ?TAKE these medications   ? ?Blood Pressure Monitor Automat Devi ?1 Device by Does not apply route daily. Automatic blood pressure cuff regular size. To monitor blood pressure regularly at home. ICD-10 code: O93.90 ?  ?hydroxychloroquine 200 MG tablet ?Commonly known as: Plaquenil ?Take 1 tablet (200 mg total) by mouth 2 (two) times daily. ?  ?M-Natal Plus 27-1 MG Tabs ?Take 1 tablet by mouth daily. ?  ? ?  ? ? ? ?Misty Stanley Leftwich-Kirby ?Certified Nurse-Midwife ?06/04/2021  ?5:51 AM ? ? ?  ?

## 2021-06-06 LAB — GC/CHLAMYDIA PROBE AMP (~~LOC~~) NOT AT ARMC
Chlamydia: NEGATIVE
Comment: NEGATIVE
Comment: NORMAL
Neisseria Gonorrhea: NEGATIVE

## 2021-06-16 NOTE — Progress Notes (Deleted)
Office Visit Note  Patient: Cheryl Davila             Date of Birth: [birth year]           MRN: [id number]             PCP: Medicine, [facility] Referring: Michelene Gardener, * Visit Date: 06/30/2021 Occupation: @GUAROCC @  Subjective:  Medication monitoring  History of Present Illness: Cheryl Davila is a 26 y.o. female with history of systemic lupus erythematosus.  Patient was initiated on Plaquenil after her last office visit.  She is currently taking Plaquenil 200 mg 1 tablet by mouth twice daily.  Activities of Daily Living:  Patient reports morning stiffness for *** {minute/hour:19697}.   Patient {ACTIONS;DENIES/REPORTS:21021675::"Denies"} nocturnal pain.  Difficulty dressing/grooming: {ACTIONS;DENIES/REPORTS:21021675::"Denies"} Difficulty climbing stairs: {ACTIONS;DENIES/REPORTS:21021675::"Denies"} Difficulty getting out of chair: {ACTIONS;DENIES/REPORTS:21021675::"Denies"} Difficulty using hands for taps, buttons, cutlery, and/or writing: {ACTIONS;DENIES/REPORTS:21021675::"Denies"}  No Rheumatology ROS completed.   PMFS History:  Patient Active Problem List   Diagnosis Date Noted   Fetal arrhythmia affecting pregnancy, antepartum 06/19/2019   Abnormal antibody titer 06/18/2019   Biological false positive RPR test 04/23/2019   Supervision of normal first pregnancy, antepartum 04/01/2019    No past medical history on file.  Family History  Problem Relation Age of Onset   Depression Mother    Healthy Sister    Healthy Sister    Healthy Brother    Healthy Brother    Healthy Brother    Diabetes Maternal Grandmother    Congenital heart disease Daughter        pacemaker   Past Surgical History:  Procedure Laterality Date   CESAREAN SECTION  2021   Social History   Social History Narrative   Not on file    There is no immunization history on file for this patient.   Objective: Vital Signs: LMP 04/15/2021 (Exact Date)    Physical  Exam Vitals and nursing note reviewed.  Constitutional:      Appearance: She is well-developed.  HENT:     Head: Normocephalic and atraumatic.  Eyes:     Conjunctiva/sclera: Conjunctivae normal.  Cardiovascular:     Rate and Rhythm: Normal rate and regular rhythm.     Heart sounds: Normal heart sounds.  Pulmonary:     Effort: Pulmonary effort is normal.     Breath sounds: Normal breath sounds.  Abdominal:     General: Bowel sounds are normal.     Palpations: Abdomen is soft.  Musculoskeletal:     Cervical back: Normal range of motion.  Skin:    General: Skin is warm and dry.     Capillary Refill: Capillary refill takes less than 2 seconds.  Neurological:     Mental Status: She is alert and oriented to person, place, and time.  Psychiatric:        Behavior: Behavior normal.     Musculoskeletal Exam: ***  CDAI Exam: CDAI Score: -- Patient Global: --; Provider Global: -- Swollen: --; Tender: -- Joint Exam 06/30/2021   No joint exam has been documented for this visit   There is currently no information documented on the homunculus. Go to the Rheumatology activity and complete the homunculus joint exam.  Investigation: No additional findings.  Imaging: US OB Comp Less 14 Wks  Result Date: 06/04/2021 CLINICAL DATA:  Right lower quadrant pain. EXAM: OBSTETRIC <14 WK ULTRASOUND TECHNIQUE: Transabdominal ultrasound was performed for evaluation of the gestation as well as the maternal uterus and adnexal regions. COMPARISON:  None. FINDINGS: Intrauterine gestational sac: Single Yolk sac:  Visualized. Embryo:  Visualized. Cardiac Activity: Visualized. Heart Rate: 166 bpm CRL:   28.4 mm   9 w 4 d                  [place] EDC: January 03, 2022 Subchorionic hemorrhage:  None visualized. Maternal uterus/adnexae: The right ovary is visualized and is normal in appearance. A 3.2 cm x 2.4 cm x 2.4 cm simple cyst is seen within the left ovary. No pelvic free fluid is identified. IMPRESSION:  Single, viable intrauterine pregnancy at approximately 9 weeks and 4 days gestation by ultrasound evaluation. Electronically Signed   By: Virgina Norfolk M.D.   On: 06/04/2021 03:58    Recent Labs: Lab Results  Component Value Date   WBC 9.8 06/04/2021   HGB 11.1 (L) 06/04/2021   PLT 299 06/04/2021   NA 136 04/29/2021   K 3.8 04/29/2021   CL 104 04/29/2021   CO2 24 04/29/2021   GLUCOSE 114 (H) 04/29/2021   BUN 9 04/29/2021   CREATININE 0.66 04/29/2021   BILITOT 0.4 04/29/2021   AST 14 04/29/2021   ALT 12 04/29/2021   PROT 8.3 (H) 04/29/2021   CALCIUM 9.6 04/29/2021    Speciality Comments: No specialty comments available.  Procedures:  No procedures performed Allergies: Patient has no known allergies.   Assessment / Plan:     Visit Diagnoses: Other systemic lupus erythematosus with other organ involvement (HCC)  Lupus anticoagulant positive  High risk medication use  Pain in both hands  Other fatigue  Heart block, fetal, affecting care of mother  Orders: No orders of the defined types were placed in this encounter.  No orders of the defined types were placed in this encounter.   Face-to-face time spent with patient was *** minutes. Greater than 50% of time was spent in counseling and coordination of care.  Follow-Up Instructions: No follow-ups on file.   Ofilia Neas, PA-C  Note - This record has been created using Dragon software.  Chart creation errors have been sought, but may not always  have been located. Such creation errors do not reflect on  the standard of medical care.

## 2021-06-30 ENCOUNTER — Ambulatory Visit: Payer: Medicaid Other | Admitting: Physician Assistant

## 2021-06-30 DIAGNOSIS — R5383 Other fatigue: Secondary | ICD-10-CM

## 2021-06-30 DIAGNOSIS — Z79899 Other long term (current) drug therapy: Secondary | ICD-10-CM

## 2021-06-30 DIAGNOSIS — M79641 Pain in right hand: Secondary | ICD-10-CM

## 2021-06-30 DIAGNOSIS — M3219 Other organ or system involvement in systemic lupus erythematosus: Secondary | ICD-10-CM

## 2021-06-30 DIAGNOSIS — R760 Raised antibody titer: Secondary | ICD-10-CM

## 2021-06-30 DIAGNOSIS — O35BXX0 Maternal care for other (suspected) fetal abnormality and damage, fetal cardiac anomalies, not applicable or unspecified: Secondary | ICD-10-CM

## 2021-07-05 ENCOUNTER — Telehealth: Payer: Medicaid Other

## 2021-07-05 NOTE — Progress Notes (Signed)
Attempt to reach patient. Unable to LVM due to mailbox is full.  ?[phone] Fele[address] Shelling cma  ?

## 2021-08-08 ENCOUNTER — Other Ambulatory Visit (HOSPITAL_COMMUNITY)
Admission: RE | Admit: 2021-08-08 | Discharge: 2021-08-08 | Disposition: A | Discharge status: Home / Self Care | Payer: Medicaid Other | Source: Ambulatory Visit | Attending: Medical | Admitting: Medical

## 2021-08-08 ENCOUNTER — Encounter: Payer: Self-pay | Admitting: Medical

## 2021-08-08 ENCOUNTER — Ambulatory Visit (INDEPENDENT_AMBULATORY_CARE_PROVIDER_SITE_OTHER): Payer: Medicaid Other | Admitting: Medical

## 2021-08-08 ENCOUNTER — Other Ambulatory Visit: Payer: Self-pay | Admitting: Medical

## 2021-08-08 VITALS — BP 107/72 | HR 87 | Wt 167.7 lb

## 2021-08-08 DIAGNOSIS — Z98891 History of uterine scar from previous surgery: Secondary | ICD-10-CM

## 2021-08-08 DIAGNOSIS — O0990 Supervision of high risk pregnancy, unspecified, unspecified trimester: Secondary | ICD-10-CM | POA: Insufficient documentation

## 2021-08-08 DIAGNOSIS — Z3A18 18 weeks gestation of pregnancy: Secondary | ICD-10-CM

## 2021-08-08 DIAGNOSIS — Z3402 Encounter for supervision of normal first pregnancy, second trimester: Secondary | ICD-10-CM

## 2021-08-08 DIAGNOSIS — M329 Systemic lupus erythematosus, unspecified: Secondary | ICD-10-CM

## 2021-08-08 LAB — POCT URINALYSIS DIP (DEVICE)
Bilirubin Urine: NEGATIVE
Glucose, UA: NEGATIVE mg/dL
Hgb urine dipstick: NEGATIVE
Ketones, ur: NEGATIVE mg/dL
Nitrite: NEGATIVE
Protein, ur: 30 mg/dL — AB
Specific Gravity, Urine: >=1.03 (ref 1.005–1.030)
Urobilinogen, UA: 0.2 mg/dL (ref 0.0–1.0)
pH: 6.5 (ref 5.0–8.0)

## 2021-08-08 MED ORDER — ASPIRIN 81 MG PO TBEC: 81.0000 mg | DELAYED_RELEASE_TABLET | Freq: Every day | ORAL | 12 refills | Status: DC

## 2021-08-08 MED ORDER — BLOOD PRESSURE KIT DEVI: 1.0000 | 0 refills | Status: DC

## 2021-08-08 NOTE — Progress Notes (Signed)
   PRENATAL VISIT NOTE  Subjective:  Cheryl Davila is a 26 y.o. G2P1001 at [redacted]w[redacted]d being seen today for her first prenatal visit for this pregnancy.  She is currently monitored for the following issues for this high-risk pregnancy and has Biological false positive RPR test; Supervision of high risk pregnancy, antepartum; SLE (systemic lupus erythematosus related syndrome) (HCC); and H/O cesarean section on their problem list.  Patient reports no complaints.  Contractions: Not present. Vag. Bleeding: None.  Movement: Present. Denies leaking of fluid.   She is planning to breastfeed. Unsure about contraception plan.   The following portions of the patient's history were reviewed and updated as appropriate: allergies, current medications, past family history, past medical history, past social history, past surgical history and problem list.   Objective:   Vitals:   08/08/21 1335  BP: 107/72  Pulse: 87  Weight: 167 lb 11.2 oz (76.1 kg)    Fetal Status: Fetal Heart Rate (bpm): 146   Movement: Present     General:  Alert, oriented and cooperative. Patient is in no acute distress.  Skin: Skin is warm and dry. No rash noted.   Cardiovascular: Normal heart rate and rhythm noted  Respiratory: Normal respiratory effort, no problems with respiration noted. Clear to auscultation.   Abdomen: Soft, gravid, appropriate for gestational age. Non-tender. Pain/Pressure: Absent     Pelvic: Cervical exam deferred        Extremities: Normal range of motion.  Edema: None  Mental Status: Normal mood and affect. Normal behavior. Normal judgment and thought content.    Indications for ASA therapy (per uptodate) One of the following: Previous pregnancy with preeclampsia, especially early onset and with an adverse outcome No Multifetal gestation No Chronic hypertension No Type 1 or 2 diabetes mellitus No Chronic kidney disease No Autoimmune disease (antiphospholipid syndrome, systemic lupus erythematosus)  Yes   Assessment and Plan:  Pregnancy: G2P1001 at [redacted]w[redacted]d 1. Encounter for supervision of low-risk first pregnancy in second trimester - [facility] AMB BABYSCRIPTS SCHEDULE OPTIMIZATION  2. Supervision of high risk pregnancy, antepartum - [place] MFM OB DETAIL +14 WK; Future - CBC/D/Plt+RPR+Rh+ABO+RubIgG... - Genetic Screening - Culture, OB Urine - Hemoglobin A1c - GC/Chlamydia probe amp ([place])not at [facility] - Patient had normal pap smear 01/2021 - available in Care Everywhere  - N/V from early pregnancy has resolved  3. SLE (systemic lupus erythematosus related syndrome) (HCC) - ASA recommended - Discussed delivery timing and antenatal testing plan   4. H/O cesarean section - Desires TOLAC, advised we will review all risks and benefits to both options in third trimester before signing consent   5. [redacted] weeks gestation of pregnancy  Preterm labor/second trimester warning symptoms and general obstetric precautions including but not limited to vaginal bleeding, contractions, leaking of fluid and fetal movement were reviewed in detail with the patient. Please refer to After Visit Summary for other counseling recommendations.   Discussed the normal visit cadence for prenatal care Discussed the nature of our practice with multiple providers including residents and students   Return in about 4 weeks (around 09/05/2021) for [facility] MD only, In-Person.  Future Appointments  Date Time Provider Department Center  08/18/2021  1:40 PM Gearldine Bienenstock, PA-C [facility] None  09/01/2021  1:30 PM [facility] NURSE [facility]  09/01/2021  1:45 PM [facility] US5 [facility]  09/05/2021  3:15 PM Warden Fillers, MD [facility]    Vonzella Nipple, PA-C

## 2021-08-08 NOTE — Patient Instructions (Addendum)
AREA PEDIATRIC/FAMILY PRACTICE PHYSICIANS  [place] ([address]) [facility] Chambliss, MD; Eniola, MD; Hale, MD; Hensel, MD; McDiarmid, MD; McIntyer, MD; Neal, MD; Walden, MD [address] [place], [place] [address] [phone] Mon-Fri 8:30-12:30, 1:30-5:00 Providers come to see babies at [facility] Accepting Medicaid [facility] providers who accept newborns: Koirala, MD; Morrow, MD; Wolters, MD [address], [place], [place] [address] [phone] Mon-Fri 8:00-5:30 Babies seen by providers at [facility] Does NOT accept Medicaid Please call early in hospitalization for appointment (limited availability)  [facility] Mulberry, MD [address] [place], [place] [address] [phone] Mon, Tue, Thur, Fri 8:30-5:00, Wed 10:00-7:00 (closed 1-2pm) Babies seen by [facility] providers Accepting Medicaid Rubin - Pediatrician Rubin, MD [address], [place], [place] [address] [phone] Mon-Fri 8:30-5:00, Sat 8:30-12:00 Provider comes to see babies at [facility] Accepting Medicaid Must have been referred from current patients or contacted office prior to delivery [facility] ([facility]) Brown, MD; Chandler, MD; Ettefagh, MD; Grant, MD; Lester, MD; McCormick, MD; McQueen, MD; Prose, MD; Simha, MD; Stanley, MD; Stryffeler, NP; Tebben, NP [address], [place], [place] [address] [phone] Mon, Tue, Thur, Fri 8:30-5:30, Wed 9:30-5:30, Sat 8:30-12:30 Babies seen by [facility] providers Accepting Medicaid Only accepting infants of first-time parents or siblings of current patients Hospital discharge coordinator will make follow-up appointment Jack Amos [address], [place], [place]  [address] [phone]   Fax - [phone] [facility] [address], [place], [place]  [address] Phone - [phone]   Fax - [phone] Shilpa Gosrani [address], [place], [place]  [address] [phone]  [place] ([address])  [facility] Bates, MD; Brassfield, MD; Cooper, Cox, MD; MD; Davis, MD; Dovico, MD; Ettefaugh, MD; Little, MD; Lowe, MD; Keiffer, MD; Melvin, MD; Sumner, MD; Williams, MD [address], [place], [place] [address] [phone] Mon-Fri 8:30-5:00 (extended evenings Mon-Thur as needed), Sat-Sun 10:00-1:00 Providers come to see babies at [facility] Accepting Medicaid for families of first-time babies and families with all children in the household age 3 and under. Must register with office prior to making appointment (M-F only). [facility] Henson, NP; Knapp, MD; Lalonde, MD; Tysinger, PA [address] [place], [place] [address] [phone] Mon-Fri 8:00-5:00 Babies seen by providers at [facility] Does NOT accept Medicaid/Commercial Insurance Only [facility] ([facility])  Artis, MD; Barnes, MD; Bratton, MD; Coccaro, MD; Lockett Gardner, MD; Kramer, MD; Marshall, MD; Netherton, MD; Poleto, MD; Skinner, MD [address] [place], [place] [address] [phone] Mon-Fri 8:30-5:30, Sat (Oct.-Mar.) 9:00-1:00 Babies seen by providers at [facility] Accepting Medicaid  [place] ([address]) [facility] Reid, MD; Warner, MD [address], [place], [place] [address] [phone] Mon-Fri 8:30-5:00, Sat 8:30-12:00 Providers come to see babies at [facility] Does NOT accept Medicaid [facility] Becker, PA; Hagler, MD; Scifres, PA; Sun, MD; Swayne, MD [address], [place],  [address] [phone] Mon-Fri 8:00-5:00 Babies seen by providers at [facility] Does NOT accept Medicaid Only accepting babies of parents who  are patients Please call early in hospitalization for appointment (limited availability)  Pediatricians Clark, MD; Frye, MD; Kelleher, MD; Mack, NP; Robidoux, MD; O'Keller, MD; Patterson, NP; Pudlo, MD; Puzio, MD; Thomas, MD; Tucker, MD; Twiselton, MD [address], [place], [place] [address] [phone] Mon-Fri 8:00-5:00, Sat 9:00-12:00 Providers come to see babies at [facility] Does NOT accept Medicaid  [place] ([address]) [facility] providers accepting new patients: Brake, NP; Wharton, PA [address], [place], [place] [address] [phone] Mon-Fri 8:00-5:00 Babies seen by providers at [facility] Does NOT accept Medicaid Only accepting babies of parents who are patients Please call early in hospitalization for appointment (limited availability) [facility] Gay, MD; Quinlan, MD [address] [place], [place] [address] [phone] (press 1 to schedule appointment) Mon-Fri 8:00-5:00 Providers come to see babies at [facility] Does NOT accept Medicaid [facility] Mazer, MD [address] [place], [place] [address] [phone] Mon-Fri 8:30-5:00 (lunch 12:30-1:00), extended hours by appointment only Wed 5:00-6:30 Babies seen by [facility] providers Accepting Medicaid [facility] Banks, MD; Jordan, MD; Koberlein, MD [address], [place], [place] [address] [phone] Mon-Fri 8:00-5:00 Babies seen by [facility] providers Does NOT accept Medicaid [facility] Parker, MD; Hunter, MD; Wallace, DO [address] [place], [place] [address] [phone] Mon-Fri 8:00-5:00 Babies seen by [facility] providers Does NOT accept Medicaid [facility] Brandon, PA; Brecken, PA; Christy, NP; Dees, MD; DeClaire, MD; DeWeese, MD; Hansen, NP; Mills, NP; Parrish, NP; Smoot, NP; Summer, MD; Vapne,  MD [address] [place], [place] [address] [phone] Mon-Fri 8:30-5:00, Sat 10:00-1:00 Providers come to see babies at [facility] Does NOT accept Medicaid Free prenatal information session Tuesdays at 4:45pm [facility] Bouska, MD; [place], PA; Jeffery, PA; Weber, PA [address] [place] [address] [phone] Mon-Fri 7:30-5:30 Babies seen by [facility] providers [facility] [address], [place], [place]  [address] [phone]   Fax - [phone]  [place] ([address] & [address]) [facility] [place] [address] [place], [place] [address] [phone] Mon-Thur 8:00-6:00 Providers come to see babies at [facility] Accepting Medicaid [facility] Anderson, NP; Badger, MD; Beal, PA; Spencer, PA [address] [place], [place] [address] [phone] Mon-Thur 7:30-7:30, Fri 7:30-4:30 Babies seen by [facility] providers Accepting Medicaid [facility] Agbuya, MD; Klett, NP; Romgoolam, MD [address], [place], [place] [address] [phone] Mon-Fri 8:30-5:00, Sat 8:30-12:00 Providers come to see babies at [facility] Accepting Medicaid Must have "Meet & Greet" appointment at office prior to delivery [facility]) McCord, MD; Wallace, MD; Wood, MD [address], [place], [place] [address] [phone] Mon-Wed 8:00-6:00, Thur-Fri 8:00-5:00, Sat 9:00-12:00 Providers come to see babies at [facility] Does NOT accept Medicaid Only accepting siblings of current patients [facility]  [address], [place], [place]  [address] [phone]   Fax - [phone] [facility] [address], [place], [place]  [address] [phone]   Fax -  [phone]  [place] ([address] & [address]) [facility] Cirigliano, DO; Matthews, DO [address] , [place] [address] [phone] Mon-Fri 7:00-5:00 Babies seen by [facility] providers Does NOT accept Medicaid [facility] Briscoe, MD; Howley, PA; Moreira, PA [address], [place],  [address] [phone] Mon-Fri 8:00-5:00 Babies seen by [facility] providers Accepting Medicaid [facility] Boyd, MD; Church, PA; Jones, NP; Osborn, PA [address], ,  [address] [phone] Mon-Fri 8:00-5:00 Babies seen by providers at [facility] Accepting Medicaid  [place] ([address]) [facility] [place] Wendling, DO [address] [place], [place] [address] [phone] Mon-Fri 8:00-5:00 Babies seen by [facility] providers Does NOT accept Medicaid Limited availability, please call early in hospitalization to schedule follow-up [facility] Calderon, PA; Cummings, MD; Dillard, MD; Martin, PA; Olson, MD; VanDeven, PA [address], [place], [place] [address] [phone] Mon-Fri 8:30-5:00, Sat 9:00-12:00 Babies seen by providers at [facility] Accepting Medicaid Please register online then schedule online or call office [email or web address]) Hunter, NP; Kumar, MD; Martin Rogers, PA [address], [place], [place] [address] [phone] Mon-Fri 8:00-5:00 Babies seen by providers at [facility] Accepting Medicaid [facility] ([facility]) Hudson, MD; Kristi Fleenor, NP; West, MD [address], [place], [place] [address] [phone] Mon-Fri 8:00-5:30, Sat&Sun by appointment (phones open at  8:30) Babies seen by [facility] providers Accepting Medicaid Must be a first-time baby or sibling of current patient [facility] [place]  [address], [place], [place]  [address] [phone]   Fax - [phone]  [place] ([address]) [facility] Brown, PA; Cowen, PA; Rice, MD; Helton, PA; Spry, MD [address] [place], [place] [address] [phone] Mon-Thur 8:00-7:00, Fri 8:00-5:00, Sat 8:00-12:00, Sun 9:00-12:00 Babies seen by [facility] providers Accepting Medicaid [facility] Coe-Goins, MD; Marshall, MD; Pierre-Louis, MD [address], [place], [place] [address] [phone] Mon-Thur 8:00-5:00 Babies seen by providers at [facility] Accepting Medicaid [facility] Bratton, MD; Coe-Goins, MD; Hayes, MD; Lewis, MD; List, MD; Lott, MD; Marshall, MD; Moran, MD; O'Neal, MD; Pierre-Louis, MD; Pitonzo, MD; Scholer, MD; Spangle, MD [address] [place], [place] [address] [phone] Mon-Fri 8:00-5:30, Sat (Oct.-Mar.) 9:00-1:00 Babies seen by providers at [facility] Accepting Medicaid Must fill out new patient packet, available online at [email or web address] [facility] ([facility]) Friddle, NP; Harris, NP; Kelly, NP; Logan, MD; Melvin, PA; Poth, MD; Ramadoss, MD; Stanton, NP [address], [place], [place] [address] [phone] Mon-Thur 8:00-5:30, Fri 8:00-5:00 Babies seen by providers at [facility] Accepting Medicaid  [place] ([address]) [facility] Dixon, PA; Capulin, MD; Pickard, MD; Tapia, PA [address], [place], [place] [address] [phone] Mon-Fri 8:00-5:00 Babies seen by providers at [facility] Accepting Medicaid   [place] ([address]) [facility] [place] Masneri, DO; Meyers, MD; Nelson, PA [address], [place], [place] [address] [phone] Mon-Fri 8:00-5:00 Babies seen by providers at [facility] Does NOT accept Medicaid Limited appointment availability, please call early in hospitalization  [facility] [place] Kunedd, DO; McGowen, MD [address], [place], [place] [address] [phone] Mon-Fri 8:00-5:00 Babies seen by [facility] providers Does NOT accept Medicaid [facility] [place] Cameron, MD; MacDonald, MD; Michaels, PA; Nayak, MD [address] [place] [address], [place], [place] [address] [phone] Mon-Fri 8:00-5:00 After hours clinic ([address] [place],  [address]) [phone] Mon-Fri 5:00-8:00, Sat 12:00-6:00, Sun 10:00-4:00 Babies seen by [facility] providers Accepting Medicaid [facility] [place] [address], [place], [place]  [address] [phone]   Fax - [phone]  [place] ([address]) [facility] [place] [facility] Andy, MD [address], [place], [place] [address] [phone] Mon-Fri 8:00-5:00 Babies seen by [facility] providers Does NOT accept Medicaid [facility] [place] ([facility] [place]) Eksir, MD [address], [place], [place] [address] [phone] Mon-Thur 8:00-7:00, Fri 8:00-5:00, Sat 8:00-12:00 Babies seen by providers at [facility] Accepting Medicaid - but does not have vaccinations in office (must be received elsewhere) Limited availability, please call early in hospitalization  [place] ([address]) [facility]  Charlene Flemming, MD [address], [place] [address] [phone]  Fax [phone]  [place] [facility]  Kimberly Newton, MD, Annamarie Streilein, PA, Carla Hampton, PA [address] [place], [place]  [address] [phone] [facility]  [address], [place], [place] [address] [phone] [address], [place], [place] [address] [phone] (West Office)  [facility] [address], [place], [place] [address] [phone] [facility] [address], [place], [place] [address] [phone] [facility] [address], [place], [place] [address] [phone] [facility] [address], [place], [place] [address] [phone] [facility] [address], [place], [place] [address] [phone] [facility] [address], [place], [place] [address] [phone] [facility]  [address], [place], [place] [address] [phone] Dr[facility] [address], [place], [place] [address] [phone] [facility] [address], [place], [place] [address] [phone] [facility] [address], [place], [place] [address] [phone]  Childbirth Education Options: [facility] Classes:  Childbirth education classes can help you get ready for a positive parenting experience. You can also meet other expectant parents and get free stuff for your baby. Each class runs for five weeks on the same night and costs $45 for the mother-to-be and her support person. Medicaid covers the cost if you are eligible. Call [phone] to register. [facility] Childbirth Education: Classes can vary in availability and schedule is subject to change. For most up-to-date information please visit [email or web address] to review and register.    Safe Medications in Pregnancy   Acne:  Benzoyl Peroxide  Salicylic Acid   Backache/Headache:  Tylenol: 2 regular strength every 4 hours OR               2 Extra strength every 6 hours   Colds/Coughs/Allergies:  Benadryl (alcohol free) 25 mg every 6 hours as needed  Breath  right strips  Claritin  Cepacol throat lozenges  Chloraseptic throat spray  Cold-Eeze- up to three times per day  Cough drops, alcohol free  Flonase (by prescription only)  Guaifenesin  Mucinex  Robitussin DM (plain only, alcohol free)  Saline nasal spray/drops  Sudafed (pseudoephedrine) & Actifed * use only after [redacted] weeks gestation and if you do not have high blood pressure  Tylenol  Vicks Vaporub  Zinc lozenges  Zyrtec   Constipation:  Colace  Ducolax suppositories  Fleet enema  Glycerin suppositories  Metamucil  Milk of magnesia  Miralax  Senokot  Smooth move tea   Diarrhea:  Kaopectate  Imodium A-D   *NO pepto Bismol   Hemorrhoids:  Anusol  Anusol HC  Preparation H  Tucks   Indigestion:  Tums  Maalox  Mylanta  Zantac  Pepcid   Insomnia:  Benadryl (alcohol free) 25mg every 6   hours as needed  Tylenol PM  Unisom, no Gelcaps   Leg Cramps:  Tums  MagGel   Nausea/Vomiting:  Bonine  Dramamine  Emetrol  Ginger extract  Sea bands  Meclizine  Nausea medication to take during pregnancy:  Unisom (doxylamine succinate 25 mg tablets) Take one tablet daily at bedtime. If symptoms are not adequately controlled, the dose can be increased to a maximum recommended dose of two tablets daily (1/2 tablet in the morning, 1/2 tablet mid-afternoon and one at bedtime).  Vitamin B6 100mg tablets. Take one tablet twice a day (up to 200 mg per day).   Skin Rashes:  Aveeno products  Benadryl cream or 25mg every 6 hours as needed  Calamine Lotion  1% cortisone cream   Yeast infection:  Gyne-lotrimin 7  Monistat 7    **If taking multiple medications, please check labels to avoid duplicating the same active ingredients  **take medication as directed on the label  ** Do not exceed 4000 mg of tylenol in 24 hours  **Do not take medications that contain aspirin or ibuprofen              

## 2021-08-08 NOTE — Progress Notes (Signed)
Patient ultrasound scheduled for 09/01/21 at 1:30 PM. Patient notified.

## 2021-08-09 LAB — CBC/D/PLT+RPR+RH+ABO+RUBIGG...
Antibody Screen: NEGATIVE
Basophils Absolute: 0.1 10*3/uL (ref 0.0–0.2)
Basos: 0 %
EOS (ABSOLUTE): 0.1 10*3/uL (ref 0.0–0.4)
Eos: 1 %
HCV Ab: NONREACTIVE
HIV Screen 4th Generation wRfx: NONREACTIVE
Hematocrit: 33 % — ABNORMAL LOW (ref 34.0–46.6)
Hemoglobin: 10.7 g/dL — ABNORMAL LOW (ref 11.1–15.9)
Hepatitis B Surface Ag: NEGATIVE
Immature Grans (Abs): 0 10*3/uL (ref 0.0–0.1)
Immature Granulocytes: 0 %
Lymphocytes Absolute: 2.2 10*3/uL (ref 0.7–3.1)
Lymphs: 19 %
MCH: 23.7 pg — ABNORMAL LOW (ref 26.6–33.0)
MCHC: 32.4 g/dL (ref 31.5–35.7)
MCV: 73 fL — ABNORMAL LOW (ref 79–97)
Monocytes Absolute: 0.6 10*3/uL (ref 0.1–0.9)
Monocytes: 5 %
Neutrophils Absolute: 8.3 10*3/uL — ABNORMAL HIGH (ref 1.4–7.0)
Neutrophils: 75 %
Platelets: 319 10*3/uL (ref 150–450)
RBC: 4.51 x10E6/uL (ref 3.77–5.28)
RDW: 17.2 % — ABNORMAL HIGH (ref 11.7–15.4)
RPR Ser Ql: NONREACTIVE
Rh Factor: POSITIVE
Rubella Antibodies, IGG: 2.34 index (ref >0.99)
WBC: 11.3 10*3/uL — ABNORMAL HIGH (ref 3.4–10.8)

## 2021-08-09 LAB — GC/CHLAMYDIA PROBE AMP (~~LOC~~) NOT AT ARMC
Chlamydia: NEGATIVE
Comment: NEGATIVE
Comment: NORMAL
Neisseria Gonorrhea: NEGATIVE

## 2021-08-09 LAB — HCV INTERPRETATION

## 2021-08-09 LAB — HEMOGLOBIN A1C
Est. average glucose Bld gHb Est-mCnc: 108 mg/dL
Hgb A1c MFr Bld: 5.4 % (ref 4.8–5.6)

## 2021-08-10 LAB — URINE CULTURE, OB REFLEX

## 2021-08-10 LAB — CULTURE, OB URINE

## 2021-08-17 NOTE — Progress Notes (Unsigned)
Office Visit Note  Patient: Cheryl Davila             Date of Birth: January 31, 1996           MRN: [id number]             PCP: [facility], [facility] Referring: [facility], [facility]* Visit Date: 08/18/2021 Occupation: @GUAROCC @  Subjective:  Medication monitoring   History of Present Illness: Cheryl Davila is a 26 y.o. female with history of systemic lupus erythematosus. She was restarted on plaquenil after her last office visit on 05/20/21.  She is taking Plaquenil 200 mg 1 tablet by mouth twice daily.  She is tolerating Plaquenil without any side effects.  She has noticed a significant improvement in her symptoms since initiating therapy.  She has been having less arthralgias and fatigue on the current treatment regimen.  She states that she has not been having days where she cannot get out of bed since starting on Plaquenil.  She denies any joint pain or joint swelling at this time.  Her activity level has increased.  She is currently [redacted] weeks pregnant.  Her OB/GYN recommended taking aspirin 81 mg daily which she has been taking as recommended.  She denies any recent facial rashes or hair loss.  She has been trying to avoid direct sun exposure due to photosensitivity.  She has intermittent symptoms of Raynaud's but denies any digital ulcerations.  She experiences and symptoms of dry mouth intermittently.  She has not had any dry eyes.  She denies any sores in her mouth or nose.  She denies any shortness of breath, pleuritic chest pain, palpitations.  She states that she was evaluated by cardiologist who is planning on having a baseline echocardiogram scheduled.  She had an EKG performed by her PCP which was unremarkable.  She is scheduled for a Plaquenil eye examination at the end of July.  Activities of Daily Living:  Patient reports morning stiffness for 0 minutes.   Patient Denies nocturnal pain.  Difficulty dressing/grooming: Denies Difficulty climbing stairs:  Denies Difficulty getting out of chair: Denies Difficulty using hands for taps, buttons, cutlery, and/or writing: Denies  Review of Systems  Constitutional:  Negative for fatigue.  HENT:  Negative for mouth sores, mouth dryness and nose dryness.   Eyes:  Negative for pain, visual disturbance and dryness.  Respiratory:  Negative for cough, hemoptysis, shortness of breath and difficulty breathing.   Cardiovascular:  Negative for chest pain, palpitations, hypertension and swelling in legs/feet.  Gastrointestinal:  Negative for blood in stool, constipation and diarrhea.  Endocrine: Negative for increased urination.  Genitourinary:  Negative for painful urination.  Musculoskeletal:  Negative for joint pain, joint pain, joint swelling, myalgias, muscle weakness, morning stiffness, muscle tenderness and myalgias.  Skin:  Positive for rash. Negative for color change, pallor, hair loss, nodules/bumps, skin tightness, ulcers and sensitivity to sunlight.  Allergic/Immunologic: Negative for susceptible to infections.  Neurological:  Positive for headaches. Negative for dizziness, fainting, numbness, paresthesias and weakness.  Hematological: Negative.  Negative for swollen glands.  Psychiatric/Behavioral: Negative.  Negative for depressed mood and sleep disturbance. The patient is not nervous/anxious.     PMFS History:  Patient Active Problem List   Diagnosis Date Noted   Supervision of high risk pregnancy, antepartum 08/08/2021   SLE (systemic lupus erythematosus related syndrome) (HCC) 08/08/2021   H/O cesarean section 08/08/2021   Biological false positive RPR test 04/23/2019    Past Medical History:  Diagnosis Date  Lupus (HCC)     Family History  Problem Relation Age of Onset   Depression Mother    Healthy Sister    Healthy Sister    Healthy Brother    Healthy Brother    Healthy Brother    Diabetes Maternal Grandmother    Congenital heart disease Daughter        pacemaker    Past Surgical History:  Procedure Laterality Date   CESAREAN SECTION  2021   WISDOM TOOTH EXTRACTION Bilateral    Social History   Social History Narrative   Not on file    There is no immunization history on file for this patient.   Objective: Vital Signs: BP 96/66   Pulse 69   Ht 5\' 7"  (1.702 m)   Wt 169 lb (76.7 kg)   LMP 04/15/2021 (Exact Date)   BMI 26.47 kg/m    Physical Exam Vitals and nursing note reviewed.  Constitutional:      Appearance: She is well-developed.  HENT:     Head: Normocephalic and atraumatic.  Eyes:     Conjunctiva/sclera: Conjunctivae normal.  Cardiovascular:     Rate and Rhythm: Normal rate and regular rhythm.     Heart sounds: Normal heart sounds.  Pulmonary:     Effort: Pulmonary effort is normal.     Breath sounds: Normal breath sounds.  Abdominal:     General: Bowel sounds are normal.     Palpations: Abdomen is soft.  Musculoskeletal:     Cervical back: Normal range of motion.  Skin:    General: Skin is warm and dry.     Capillary Refill: Capillary refill takes less than 2 seconds.     Comments: No malar rash No digital ulcerations or signs of gangrene.   Neurological:     Mental Status: She is alert and oriented to person, place, and time.  Psychiatric:        Behavior: Behavior normal.      Musculoskeletal Exam: C-spine, thoracic spine, lumbar spine have good range of motion.  No midline spinal tenderness.  Tenderness over the left SI joint.  Shoulder joints, elbow joints, wrist joints, MCPs, PIPs, DIPs have good range of motion with no synovitis.  She was able to make a complete fist bilaterally.  Hip joints have good range of motion with no groin pain.  Knee joints have good range of motion with no warmth or effusion.  Ankle joints have good range of motion with no tenderness or joint swelling.  No tenderness over MTP joints.  CDAI Exam: CDAI Score: -- Patient Global: --; Provider Global: -- Swollen: --; Tender:  -- Joint Exam 08/18/2021   No joint exam has been documented for this visit   There is currently no information documented on the homunculus. Go to the Rheumatology activity and complete the homunculus joint exam.  Investigation: No additional findings.  Imaging: No results found.  Recent Labs: Lab Results  Component Value Date   WBC 11.3 (H) 08/08/2021   HGB 10.7 (L) 08/08/2021   PLT 319 08/08/2021   NA 136 04/29/2021   K 3.8 04/29/2021   CL 104 04/29/2021   CO2 24 04/29/2021   GLUCOSE 114 (H) 04/29/2021   BUN 9 04/29/2021   CREATININE 0.66 04/29/2021   BILITOT 0.4 04/29/2021   AST 14 04/29/2021   ALT 12 04/29/2021   PROT 8.3 (H) 04/29/2021   CALCIUM 9.6 04/29/2021    Speciality Comments: No specialty comments available.  Procedures:  No  procedures performed Allergies: Patient has no known allergies.     Assessment / Plan:     Visit Diagnoses: Other systemic lupus erythematosus with other organ involvement (HCC) - Positive ANA, positive SSA, positive lupus anticoagulant, history of fatigue, joint pain, chest tightness, fetal congenital heart block 2021: Patient is currently [redacted] weeks pregnant.  Patient was started on Plaquenil 200 mg 1 tablet by mouth twice daily at her last office visit on 05/20/2021.  She has been tolerating Plaquenil without any side effects and has not missed any doses recently.  She has noticed significant improvement in her symptoms since initiating therapy.  She has had less joint pain and stiffness as well as less fatigue since initiating therapy.  She has no synovitis on examination today.  She has not had any shortness of breath, pleuritic chest pain, or palpitations.  According to the patient she had an EKG performed by her PCP and was also evaluated by Dr. Allen Norris, cardiologist, who plans on scheduling a baseline echocardiogram. History of positive lupus anticoagulant.  Patient is currently taking aspirin 81 mg daily as recommended by her  OB/GYN. No malar rash.  She has been wearing sunscreen on a daily basis and reapplying every 2 hours while outdoors.  Discussed the importance of avoiding direct sun exposure and encouraged.  Patient to wear a widebrimmed hat and sun protective clothing if she plans on being outdoors. She has not had any oral or nasal ulcerations.  She experiences mouth dryness intermittently.  Discussed the use of Biotene products which are available over-the-counter for symptomatic relief. The following lab work will be obtained today to assess for active disease.  She will remain on Plaquenil 200 mg 1 tablet by mouth twice daily.  She was advised to notify us if she develops signs or symptoms of a lupus flare.  She will follow-up in the office in 2 months or sooner if needed.- Plan: Anti-DNA antibody, double-stranded, C3 and C4, Sedimentation rate, Lupus Anticoagulant Eval w/Reflex  Lupus anticoagulant positive -History of positive lupus anticoagulant.  She is currently taking aspirin 81 mg daily.  Repeat LA ordered today.  Encouraged to remain on the baby aspirin and Plaquenil as prescribed.- Plan: Lupus Anticoagulant Eval w/Reflex  High risk medication use - Plaquenil added at last visit on 05/20/21.  She is currently taking Plaquenil 200 mg 1 tablet by mouth twice daily as prescribed.  CBC updated on 08/08/21.  CMP ordered today.   - Plan: COMPLETE METABOLIC PANEL WITH GFR Baseline Plaquenil examination is scheduled at the end of July 2023.  Pain in both hands: She has no joint tenderness or synovitis on examination today.  Complete fist formation bilaterally.  Patient was encouraged to remain on Plaquenil as prescribed.  Other fatigue: Her energy level has improved since initiating Plaquenil.  She has been able to increase her activity level.  Heart block, fetal, affecting care of mother - Her baby has a pacemaker due to complete heart block.  Orders: Orders Placed This Encounter  Procedures   Anti-DNA  antibody, double-stranded   C3 and C4   Sedimentation rate   Lupus Anticoagulant Eval w/Reflex   COMPLETE METABOLIC PANEL WITH GFR   Meds ordered this encounter  Medications   hydroxychloroquine (PLAQUENIL) 200 MG tablet    Sig: Take 1 tablet (200 mg total) by mouth 2 (two) times daily.    Dispense:  180 tablet    Refill:  0     Follow-Up Instructions: Return in about 2 months (  around 10/18/2021) for Systemic lupus erythematosus.   Gearldine Bienenstock, PA-C  Note - This record has been created using Dragon software.  Chart creation errors have been sought, but may not always  have been located. Such creation errors do not reflect on  the standard of medical care.

## 2021-08-18 ENCOUNTER — Encounter: Payer: Self-pay | Admitting: Physician Assistant

## 2021-08-18 ENCOUNTER — Ambulatory Visit (INDEPENDENT_AMBULATORY_CARE_PROVIDER_SITE_OTHER): Payer: Medicaid Other | Admitting: Physician Assistant

## 2021-08-18 VITALS — BP 96/66 | HR 69 | Ht 67.0 in | Wt 169.0 lb

## 2021-08-18 DIAGNOSIS — M79641 Pain in right hand: Secondary | ICD-10-CM | POA: Diagnosis not present

## 2021-08-18 DIAGNOSIS — M3219 Other organ or system involvement in systemic lupus erythematosus: Secondary | ICD-10-CM | POA: Diagnosis not present

## 2021-08-18 DIAGNOSIS — O35BXX0 Maternal care for other (suspected) fetal abnormality and damage, fetal cardiac anomalies, not applicable or unspecified: Secondary | ICD-10-CM

## 2021-08-18 DIAGNOSIS — R760 Raised antibody titer: Secondary | ICD-10-CM

## 2021-08-18 DIAGNOSIS — Z79899 Other long term (current) drug therapy: Secondary | ICD-10-CM | POA: Diagnosis not present

## 2021-08-18 DIAGNOSIS — R5383 Other fatigue: Secondary | ICD-10-CM

## 2021-08-18 DIAGNOSIS — M79642 Pain in left hand: Secondary | ICD-10-CM

## 2021-08-18 MED ORDER — HYDROXYCHLOROQUINE SULFATE 200 MG PO TABS: 200.0000 mg | ORAL_TABLET | Freq: Two times a day (BID) | ORAL | 0 refills | Status: DC

## 2021-08-19 NOTE — Progress Notes (Signed)
Complements WNL

## 2021-08-19 NOTE — Progress Notes (Signed)
CMP stable. ESR remains elevated.  Patient is currently pregnant.

## 2021-08-23 NOTE — Progress Notes (Signed)
dsDNA is negative

## 2021-08-24 LAB — COMPLETE METABOLIC PANEL WITH GFR
AG Ratio: 0.9 (calc) — ABNORMAL LOW (ref 1.0–2.5)
ALT: 11 U/L (ref 6–29)
AST: 13 U/L (ref 10–30)
Albumin: 3.6 g/dL (ref 3.6–5.1)
Alkaline phosphatase (APISO): 63 U/L (ref 31–125)
BUN: 7 mg/dL (ref 7–25)
CO2: 21 mmol/L (ref 20–32)
Calcium: 8.8 mg/dL (ref 8.6–10.2)
Chloride: 103 mmol/L (ref 98–110)
Creat: 0.55 mg/dL (ref 0.50–0.96)
Globulin: 3.8 g/dL (calc) — ABNORMAL HIGH (ref 1.9–3.7)
Glucose, Bld: 84 mg/dL (ref 65–99)
Potassium: 3.7 mmol/L (ref 3.5–5.3)
Sodium: 133 mmol/L — ABNORMAL LOW (ref 135–146)
Total Bilirubin: 0.3 mg/dL (ref 0.2–1.2)
Total Protein: 7.4 g/dL (ref 6.1–8.1)
eGFR: 131 mL/min/{1.73_m2} (ref >=60)

## 2021-08-24 LAB — LUPUS ANTICOAGULANT EVAL W/ REFLEX
PTT-LA Screen: 33 s (ref <=40)
dRVVT: 36 s (ref <=45)

## 2021-08-24 LAB — SEDIMENTATION RATE: Sed Rate: 48 mm/h — ABNORMAL HIGH (ref 0–20)

## 2021-08-24 LAB — C3 AND C4
C3 Complement: 168 mg/dL (ref 83–193)
C4 Complement: 19 mg/dL (ref 15–57)

## 2021-08-24 LAB — ANTI-DNA ANTIBODY, DOUBLE-STRANDED: ds DNA Ab: 3 IU/mL

## 2021-08-24 NOTE — Progress Notes (Signed)
Lupus anticoagulant was not detected which is good news.  Please notify the patient.

## 2021-08-26 ENCOUNTER — Telehealth: Payer: Self-pay

## 2021-08-26 NOTE — Telephone Encounter (Signed)
Attempt to reach patient to inform her panorama genetic screen result will be upload in her MyChart.   No answer and unable to LVM due to mailbox is full.  Will send patient a MyChart message.  Felecia Shelling CMA

## 2021-09-01 ENCOUNTER — Ambulatory Visit: Payer: Medicaid Other | Admitting: *Deleted

## 2021-09-01 ENCOUNTER — Ambulatory Visit: Payer: Medicaid Other | Attending: Medical

## 2021-09-01 ENCOUNTER — Other Ambulatory Visit: Payer: Self-pay | Admitting: *Deleted

## 2021-09-01 ENCOUNTER — Ambulatory Visit: Payer: Medicaid Other | Attending: Maternal & Fetal Medicine | Admitting: Maternal & Fetal Medicine

## 2021-09-01 VITALS — BP 105/61 | HR 72

## 2021-09-01 DIAGNOSIS — Z8774 Personal history of (corrected) congenital malformations of heart and circulatory system: Secondary | ICD-10-CM | POA: Diagnosis not present

## 2021-09-01 DIAGNOSIS — D6862 Lupus anticoagulant syndrome: Secondary | ICD-10-CM

## 2021-09-01 DIAGNOSIS — O99112 Other diseases of the blood and blood-forming organs and certain disorders involving the immune mechanism complicating pregnancy, second trimester: Secondary | ICD-10-CM | POA: Insufficient documentation

## 2021-09-01 DIAGNOSIS — O0990 Supervision of high risk pregnancy, unspecified, unspecified trimester: Secondary | ICD-10-CM | POA: Insufficient documentation

## 2021-09-01 DIAGNOSIS — M329 Systemic lupus erythematosus, unspecified: Secondary | ICD-10-CM

## 2021-09-01 DIAGNOSIS — O34219 Maternal care for unspecified type scar from previous cesarean delivery: Secondary | ICD-10-CM

## 2021-09-02 NOTE — Progress Notes (Signed)
MFM Consultation  Ms. Julian is a G2P1 who is here at 50 w 3d at the request of Vonzella Nipple, NP  Her pregnancy is complicated by known lupus with a prior child affected by 3rd degree congenital heart block  She is overall doing well without s/sx of active Lupus. She has seen her rheumatologist and is taking plaquenil 200 mg BID. She does not have a history of hypertension or APLAS. She is taking low dose ASA.  Her prior pregnancy was delivered at term with 3rd degree heart block. She is doing well with a Visual merchandiser.  Single intrauterine pregnancy here for a detailed anatomy due to type 2 diabetes Normal anatomy with measurements consistent with dates There is good fetal movement and amniotic fluid volume Suboptimal views of the fetal anatomy were obtained secondary to fetal position.  I discussed todays visit with Ms. Lottes. We discussed th 15-20% risk of recurrence for CHD and neonatal lupus. We have submitted a referral to Dr. Casilda Carls for a fetal echocardiogram. We discussed that treatment is experimental as she is aware.  We recommend weekly fetal auscultation in clinic 24-36 weeks, serial growth ultrasounds and co management with pediatric cardiology.  She is in agreement with this plan.  I spent 20 minutes with > 50% in face to face consultation .  Novella Olive, MD

## 2021-09-05 ENCOUNTER — Other Ambulatory Visit: Payer: Self-pay

## 2021-09-05 ENCOUNTER — Ambulatory Visit (INDEPENDENT_AMBULATORY_CARE_PROVIDER_SITE_OTHER): Payer: Medicaid Other | Admitting: Obstetrics and Gynecology

## 2021-09-05 VITALS — BP 118/77 | HR 88 | Wt 168.6 lb

## 2021-09-05 DIAGNOSIS — Z3A22 22 weeks gestation of pregnancy: Secondary | ICD-10-CM

## 2021-09-05 DIAGNOSIS — M329 Systemic lupus erythematosus, unspecified: Secondary | ICD-10-CM

## 2021-09-05 DIAGNOSIS — O0990 Supervision of high risk pregnancy, unspecified, unspecified trimester: Secondary | ICD-10-CM

## 2021-09-05 DIAGNOSIS — Z98891 History of uterine scar from previous surgery: Secondary | ICD-10-CM

## 2021-09-05 NOTE — Progress Notes (Signed)
   PRENATAL VISIT NOTE  Subjective:  Cheryl Davila is a 26 y.o. G2P1001 at [redacted]w[redacted]d being seen today for ongoing prenatal care.  She is currently monitored for the following issues for this high-risk pregnancy and has Biological false positive RPR test; Supervision of high risk pregnancy, antepartum; SLE (systemic lupus erythematosus related syndrome) (HCC); and H/O cesarean section on their problem list.  Patient doing well with no acute concerns today. She reports no complaints.  Contractions: Not present. Vag. Bleeding: None.  Movement: Present. Denies leaking of fluid.   The following portions of the patient's history were reviewed and updated as appropriate: allergies, current medications, past family history, past medical history, past social history, past surgical history and problem list. Problem list updated.  Objective:   Vitals:   09/05/21 1537  BP: 118/77  Pulse: 88  Weight: 168 lb 9.6 oz (76.5 kg)    Fetal Status: Fetal Heart Rate (bpm): 152 Fundal Height: 23 cm Movement: Present     General:  Alert, oriented and cooperative. Patient is in no acute distress.  Skin: Skin is warm and dry. No rash noted.   Cardiovascular: Normal heart rate noted  Respiratory: Normal respiratory effort, no problems with respiration noted  Abdomen: Soft, gravid, appropriate for gestational age.  Pain/Pressure: Present     Pelvic: Cervical exam deferred        Extremities: Normal range of motion.  Edema: None  Mental Status:  Normal mood and affect. Normal behavior. Normal judgment and thought content.   Assessment and Plan:  Pregnancy: G2P1001 at [redacted]w[redacted]d  1. [redacted] weeks gestation of pregnancy   2. SLE (systemic lupus erythematosus related syndrome) (HCC) Pt continues with plaquenil, testing per MFM They do suggest weekly fetal heart auscultation from 24-26 weeks  3. Supervision of high risk pregnancy, antepartum Continue routine prenatal care  4. H/O cesarean section Information given  regarding VBAC Consent to be signed at 26-28 weeks  Preterm labor symptoms and general obstetric precautions including but not limited to vaginal bleeding, contractions, leaking of fluid and fetal movement were reviewed in detail with the patient.  Please refer to After Visit Summary for other counseling recommendations.   Return in about 2 weeks (around 09/19/2021) for [facility], in person.   Mariel Aloe, MD Faculty Attending [facility]

## 2021-09-07 ENCOUNTER — Encounter: Payer: Self-pay | Admitting: Maternal & Fetal Medicine

## 2021-09-22 ENCOUNTER — Encounter: Payer: Self-pay | Admitting: Obstetrics & Gynecology

## 2021-09-22 ENCOUNTER — Other Ambulatory Visit: Payer: Self-pay

## 2021-09-22 ENCOUNTER — Other Ambulatory Visit (HOSPITAL_COMMUNITY)
Admission: RE | Admit: 2021-09-22 | Discharge: 2021-09-22 | Disposition: A | Discharge status: Home / Self Care | Payer: Medicaid Other | Source: Ambulatory Visit | Attending: Obstetrics & Gynecology | Admitting: Obstetrics & Gynecology

## 2021-09-22 ENCOUNTER — Ambulatory Visit (INDEPENDENT_AMBULATORY_CARE_PROVIDER_SITE_OTHER): Payer: Medicaid Other | Admitting: Obstetrics & Gynecology

## 2021-09-22 VITALS — BP 108/67 | HR 81 | Wt 173.0 lb

## 2021-09-22 DIAGNOSIS — M329 Systemic lupus erythematosus, unspecified: Secondary | ICD-10-CM

## 2021-09-22 DIAGNOSIS — O0990 Supervision of high risk pregnancy, unspecified, unspecified trimester: Secondary | ICD-10-CM

## 2021-09-22 DIAGNOSIS — O23592 Infection of other part of genital tract in pregnancy, second trimester: Secondary | ICD-10-CM | POA: Diagnosis present

## 2021-09-22 DIAGNOSIS — Z98891 History of uterine scar from previous surgery: Secondary | ICD-10-CM

## 2021-09-22 DIAGNOSIS — O99891 Other specified diseases and conditions complicating pregnancy: Secondary | ICD-10-CM

## 2021-09-22 MED ORDER — TERCONAZOLE 0.8 % VA CREA: 1.0000 | TOPICAL_CREAM | Freq: Every day | VAGINAL | 0 refills | Status: DC

## 2021-09-22 NOTE — Progress Notes (Signed)
PRENATAL VISIT NOTE  Subjective:  Cheryl Davila is a 26 y.o. G2P1001 at [redacted]w[redacted]d being seen today for ongoing prenatal care.  She is currently monitored for the following issues for this high-risk pregnancy and has Biological false positive RPR test; Supervision of high risk pregnancy, antepartum; SLE (systemic lupus erythematosus related syndrome) (HCC); and H/O cesarean section on their problem list.  Patient reports vaginal irritation and believes she has a yeast infections, had this earlier this pregnancy with similar symptoms. Treated effectively then with three day course of vaginal medication. .  Contractions: Not present.  .  Movement: Present. Denies leaking of fluid.   The following portions of the patient's history were reviewed and updated as appropriate: allergies, current medications, past family history, past medical history, past social history, past surgical history and problem list.   Objective:   Vitals:   09/22/21 0822  BP: 108/67  Pulse: 81  Weight: 173 lb (78.5 kg)    Fetal Status: Fetal Heart Rate (bpm): 165   Movement: Present     General:  Alert, oriented and cooperative. Patient is in no acute distress.  Skin: Skin is warm and dry. No rash noted.   Cardiovascular: Normal heart rate noted  Respiratory: Normal respiratory effort, no problems with respiration noted  Abdomen: Soft, gravid, appropriate for gestational age.  Pain/Pressure: Absent     Pelvic: Cervical exam deferred        Extremities: Normal range of motion.     Mental Status: Normal mood and affect. Normal behavior. Normal judgment and thought content.   Imaging: [place] MFM OB DETAIL +14 WK  Result Date: 09/06/2021 ----------------------------------------------------------------------  OBSTETRICS REPORT                    (Corrected Final 09/06/2021 04:43 pm) ---------------------------------------------------------------------- Patient Info  ID #:       [id number]                          D.O.B.:   1995-08-22 (25 yrs)  Name:       Cheryl Davila                    Visit Date: 09/01/2021 02:51 pm ---------------------------------------------------------------------- Performed By  Attending:        Lin Landsman      Ref. Address:     [address]                    [place]                                                             [place], [place]                                                             [address]  Performed By:     Percell Boston          [facility]  [facility]erred By:      [facility] ---------------------------------------------------------------------- Orders  #  Description                           Code        Ordered By  1  US MFM OB DETAIL +14 WK               [id number]    Vonzella NippleJULIE WENZEL ----------------------------------------------------------------------  #  Order #                     Accession #                Episode #  1  [id number]                   [id number]                 [id number] ---------------------------------------------------------------------- Indications  Systemic lupus complicating pregnancy,         O26.892, M32.9  second trimester  Encounter for antenatal screening for          Z36.3  malformations  LR NIPS, 11.8%  Horizon Neg  [redacted] weeks gestation of pregnancy                Z3A.22 ---------------------------------------------------------------------- Fetal Evaluation  Num Of Fetuses:         1  Fetal Heart Rate(bpm):  137  Cardiac Activity:       Observed  Presentation:           Breech  Placenta:               Posterior Fundal  P. Cord Insertion:      Visualized, central  Amniotic Fluid  AFI FV:      Within normal limits                              Largest Pocket(cm)                              6.21  ---------------------------------------------------------------------- Biometry  BPD:     52.34  mm     G. Age:  21w 6d         31  %  CI:        72.22   %    70 - 86                                                          FL/HC:      20.3   %    18.4 - 20.2  HC:    195.95   mm     G. Age:  21w 6d         19  %    HC/AC:      1.01        1.06 - 1.25  AC:    193.55   mm     G. Age:  24w 0d         90  %    FL/BPD:     76.0   %    71 - 87  FL:      39.77  mm     G. Age:  22w 6d         58  %    FL/AC:      20.5   %    20 - 24  HUM:      38.7  mm     G. Age:  23w 5d         82  %  CER:      23.1  mm     G. Age:  21w 3d         63  %  CM:        5.2  mm  Est. FW:     579  gm      1 lb 4 oz     89  % ---------------------------------------------------------------------- OB History  Gravidity:    2         Term:   1        Prem:   0        SAB:   0  TOP:          0       Ectopic:  0        Living: 1 ---------------------------------------------------------------------- Gestational Age  LMP:           19w 6d        Date:  04/15/21                  EDD:   01/20/22  U/S Today:     22w 5d                                        EDD:   12/31/21  Best:          22w 2d     Det. ByLoman Chroman         EDD:   01/03/22                                      (06/04/21) ---------------------------------------------------------------------- Anatomy  Cranium:               Appears normal  LVOT:                   Appears normal  Cavum:                 Appears normal         Aortic Arch:            Appears normal  Ventricles:            Appears normal         Ductal Arch:            Appears normal  Choroid Plexus:        Appears normal         Diaphragm:              Appears normal  Cerebellum:            Appears normal         Stomach:                Appears normal, left                                                                        sided  Posterior Fossa:       Appears normal         Abdomen:                Appears  normal  Nuchal Fold:           Not applicable ([id number]    Abdominal Wall:         Appears nml (cord                         wks GA)                                        insert, abd wall)  Face:                  Appears normal         Cord Vessels:           Appears normal (3                         (orbits and profile)                           vessel cord)  Lips:                  Appears normal         Kidneys:                Appear normal  Palate:                Appears normal         Bladder:                Appears normal  Thoracic:              Appears normal  Spine:                  Not well visualized  Heart:                 Appears normal         Upper Extremities:      Appears normal                         (4CH, axis, and                         situs)  RVOT:                  Appears normal         Lower Extremities:      Appears normal  Other:  Fetus appears to be a female. Heels and 5th digit visualized. Nasal          bone previously seen. VC, 3VV and 3VTV visualized. Technically          difficult due to fetal position. ---------------------------------------------------------------------- Cervix Uterus Adnexa  Cervix  Length:            3.2  cm.  Closed. Normal appearance by transabdominal scan.  Uterus  No abnormality visualized.  Right Ovary  No adnexal mass visualized.  Left Ovary  No adnexal mass visualized.  Cul De Sac  No free fluid seen.  Adnexa  No abnormality visualized. ---------------------------------------------------------------------- Impression  MFM Consultation  Ms. Cantrelle is a G2P1 who is here at 39 w 3d at the request of  Vonzella Nipple, NP  Her pregnancy is complicated by known lupus with a prior  child affected by 3rd degree congenital heart block  She is overall doing well without s/sx of active Lupus. She  has seen her rheumatologist and is taking plaquenil 200 mg  BID.  She does not have a history of hypertension or APLAS. She  is taking low dose ASA.  Her prior pregnancy was  delivered at term with 3rd degree  heart block. She is doing well with a Visual merchandiser.  Single intrauterine pregnancy here for a detailed anatomy  Normal anatomy with measurements consistent with dates  There is good fetal movement and amniotic fluid volume  Suboptimal views of the fetal anatomy were obtained  secondary to fetal position.  I discussed todays visit with Ms. Devoto.  We discussed th 15-20% risk of recurrence for CHD and  neonatal lupus. We have submitted a referral to Dr. Casilda Carls  for a fetal echocardiogram.  We discussed that treatment is experimental as she is aware.  We recommend weekly fetal auscultation in clinic 24-36  weeks, serial growth ultrasounds and co management with  pediatric cardiology.  She is in agreement with this plan.  I spent 20 minutes with > 50% in face to face consultation .  Novella Olive, MD ----------------------------------------------------------------------                   Lin Landsman, MD Electronically Signed Corrected Final Report  09/06/2021 04:43 pm ----------------------------------------------------------------------   Assessment and Plan:  Pregnancy: G2P1001 at [redacted]w[redacted]d 1. Systemic lupus erythematosus affecting pregnancy in second trimester (HCC) On Plaquenil.  Continue scans and planned antenatal surveillance as per MFM.  2. Vaginitis affecting pregnancy in second trimester, antepartum Self-swab done, will follow up results and manage accordingly. Terazol presumptively prescribed.  - Cervicovaginal ancillary only( [place]) -  terconazole (TERAZOL 3) 0.8 % vaginal cream; Place 1 applicator vaginally at bedtime. Apply nightly for three nights.  Dispense: 20 g; Refill: 0  3. H/O cesarean section Information given about TOLAC, she will sign consent next visit.  4. Supervision of high risk pregnancy, antepartum Third trimester labs next visit. Preterm labor symptoms and general obstetric precautions including but not limited to vaginal  bleeding, contractions, leaking of fluid and fetal movement were reviewed in detail with the patient. Please refer to After Visit Summary for other counseling recommendations.   Return in about 3 weeks (around 10/13/2021) for 2 hr GTT, 3rd trimester labs, TDap, OFFICE OB VISIT (MD only).  Future Appointments  Date Time Provider [place]  09/29/2021  7:45 AM [facility] NURSE [facility]  09/29/2021  8:00 AM [facility] US1 [facility]  10/11/2021  9:00 AM Ofilia Neas, PA-C [facility] None  10/20/2021  8:35 AM Caren Macadam, MD [facility]  10/20/2021  9:30 AM [facility] LAB [facility] [place]    Verita Schneiders, MD

## 2021-09-22 NOTE — Patient Instructions (Addendum)
Return to office for any scheduled appointments. Call the office or go to the [facility]U at [facility] if: You begin to have strong, frequent contractions Your water breaks.  Sometimes it is a big gush of fluid, sometimes it is just a trickle that keeps getting your underwear wet or running down your legs You have vaginal bleeding.  It is normal to have a small amount of spotting if your cervix was checked.  You do not feel your baby moving like normal.  If you do not, get something to eat and drink and lay down and focus on feeling your baby move.   If your baby is still not moving like normal, you should call the office or go to MAU. Any other obstetric concerns.   TDaP Vaccine Pregnancy Get the Whooping Cough Vaccine While You Are Pregnant (CDC)  It is important for women to get the whooping cough vaccine in the third trimester of each pregnancy. Vaccines are the best way to prevent this disease. There are 2 different whooping cough vaccines. Both vaccines combine protection against whooping cough, tetanus and diphtheria, but they are for different age groups: Tdap: for everyone 11 years or older, including pregnant women  DTaP: for children 2 months through 6 years of age  You need the whooping cough vaccine during each of your pregnancies The recommended time to get the shot is during your 27th through 36th week of pregnancy, preferably during the earlier part of this time period. The Centers for Disease Control and Prevention (CDC) recommends that pregnant women receive the whooping cough vaccine for adolescents and adults (called Tdap vaccine) during the third trimester of each pregnancy. The recommended time to get the shot is during your 27th through 36th week of pregnancy, preferably during the earlier part of this time period. This replaces the original recommendation that pregnant women get the vaccine only if they had not previously received it. The American  College of Obstetricians and Gynecologists and the American College of Nurse-Midwives support this recommendation.  You should get the whooping cough vaccine while pregnant to pass protection to your baby frame support disabled and/or not supported in this browser  Learn why Laura decided to get the whooping cough vaccine in her 3rd trimester of pregnancy and how her baby girl was born with some protection against the disease. Also available on YouTube. After receiving the whooping cough vaccine, your body will create protective antibodies (proteins produced by the body to fight off diseases) and pass some of them to your baby before birth. These antibodies provide your baby some short-term protection against whooping cough in early life. These antibodies can also protect your baby from some of the more serious complications that come along with whooping cough. Your protective antibodies are at their highest about 2 weeks after getting the vaccine, but it takes time to pass them to your baby. So the preferred time to get the whooping cough vaccine is early in your third trimester. The amount of whooping cough antibodies in your body decreases over time. That is why CDC recommends you get a whooping cough vaccine during each pregnancy. Doing so allows each of your babies to get the greatest number of protective antibodies from you. This means each of your babies will get the best protection possible against this disease.  Getting the whooping cough vaccine while pregnant is better than getting the vaccine after you give birth Whooping cough vaccination during pregnancy is ideal so   your baby will have short-term protection as soon as he is born. This early protection is important because your baby will not start getting his whooping cough vaccines until he is 2 months old. These first few months of life are when your baby is at greatest risk for catching whooping cough. This is also when he's at greatest  risk for having severe, potentially life-threating complications from the infection. To avoid that gap in protection, it is best to get a whooping cough vaccine during pregnancy. You will then pass protection to your baby before he is born. To continue protecting your baby, he should get whooping cough vaccines starting at 2 months old. You may never have gotten the Tdap vaccine before and did not get it during this pregnancy. If so, you should make sure to get the vaccine immediately after you give birth, before leaving the hospital or birthing center. It will take about 2 weeks before your body develops protection (antibodies) in response to the vaccine. Once you have protection from the vaccine, you are less likely to give whooping cough to your newborn while caring for him. But remember, your baby will still be at risk for catching whooping cough from others. A recent study looked to see how effective Tdap was at preventing whooping cough in babies whose mothers got the vaccine while pregnant or in the hospital after giving birth. The study found that getting Tdap between 27 through 36 weeks of pregnancy is 85% more effective at preventing whooping cough in babies younger than 2 months old. Blood tests cannot tell if you need a whooping cough vaccine There are no blood tests that can tell you if you have enough antibodies in your body to protect yourself or your baby against whooping cough. Even if you have been sick with whooping cough in the past or previously received the vaccine, you still should get the vaccine during each pregnancy. Breastfeeding may pass some protective antibodies onto your baby By breastfeeding, you may pass some antibodies you have made in response to the vaccine to your baby. When you get a whooping cough vaccine during your pregnancy, you will have antibodies in your breast milk that you can share with your baby as soon as your milk comes in. However, your baby will not get  protective antibodies immediately if you wait to get the whooping cough vaccine until after delivering your baby. This is because it takes about 2 weeks for your body to create antibodies. Learn more about the health benefits of breastfeeding.  

## 2021-09-23 LAB — CERVICOVAGINAL ANCILLARY ONLY
Bacterial Vaginitis (gardnerella): POSITIVE — AB
Candida Glabrata: NEGATIVE
Candida Vaginitis: POSITIVE — AB
Chlamydia: NEGATIVE
Comment: NEGATIVE
Comment: NEGATIVE
Comment: NEGATIVE
Comment: NEGATIVE
Comment: NEGATIVE
Comment: NORMAL
Neisseria Gonorrhea: NEGATIVE
Trichomonas: NEGATIVE

## 2021-09-23 MED ORDER — METRONIDAZOLE 500 MG PO TABS: 500.0000 mg | ORAL_TABLET | Freq: Two times a day (BID) | ORAL | 0 refills | Status: AC

## 2021-09-23 NOTE — Addendum Note (Signed)
Addended by: Jaynie Collins A on: 09/23/2021 11:53 AM   Modules accepted: Orders

## 2021-09-27 NOTE — Progress Notes (Unsigned)
Office Visit Note  Patient: Cheryl Davila             Date of Birth: [birth year]           MRN: [id number]             PCP: [facility], [facility] Referring: Medicine, [place]* Visit Date: 10/11/2021 Occupation: _0 @  Subjective:  Medication monitoring   History of Present Illness: Cheryl Davila is a 26 y.o. female with history of systemic lupus erythematosus.  Patient is currently taking Plaquenil 200 mg 1 tablet by mouth twice daily and aspirin 81 mg daily.  She is tolerating Plaquenil without any side effects and has not missed any doses recently.  She has not noticed any excessive bruising while taking aspirin.  She is currently [redacted] weeks pregnant.  She is scheduled for a fetal echocardiogram on 10/13/2021 and will be following up with her OB/GYN on 10/20/2021.  She was evaluated by Dr. Gertie Exon maternal-fetal medicine specialist on 09/01/2021. Patient reports that this past weekend she celebrated her birthday as well as her daughter's birthday so she was a little more active than usual.  She states she is having some increased arthralgias but denies any joint swelling.  She continues to have chronic dry mouth and occasional mouth sores but has not had any nasal ulcerations.  She denies any recent rashes or hair loss.  She denies any shortness of breath, pleuritic chest pain, palpitations.  She denies any new medical conditions.  She denies any new concerns at this time.    Activities of Daily Living:  Patient reports morning stiffness for several hours.   Patient Denies nocturnal pain.  Difficulty dressing/grooming: Denies Difficulty climbing stairs: Denies Difficulty getting out of chair: Denies Difficulty using hands for taps, buttons, cutlery, and/or writing: Reports  Review of Systems  Constitutional:  Negative for fatigue.  HENT:  Positive for mouth dryness and sore tongue. Negative for mouth sores.   Eyes:  Negative for dryness.  Respiratory:  Negative  for shortness of breath.   Cardiovascular:  Negative for chest pain and palpitations.  Gastrointestinal:  Negative for blood in stool, constipation and diarrhea.  Endocrine: Negative for increased urination.  Genitourinary:  Negative for involuntary urination.  Musculoskeletal:  Positive for joint pain, joint pain, myalgias, morning stiffness and myalgias. Negative for gait problem, joint swelling, muscle weakness and muscle tenderness.  Skin:  Positive for color change and hair loss. Negative for rash and sensitivity to sunlight.  Allergic/Immunologic: Positive for susceptible to infections.  Neurological:  Negative for dizziness and headaches.  Hematological:  Negative for swollen glands.  Psychiatric/Behavioral:  Negative for depressed mood and sleep disturbance. The patient is not nervous/anxious.     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/2019    Past Medical History:  Diagnosis Date   Lupus ([place])     Family History  Problem Relation Age of Onset   Depression Mother    Healthy Sister    Healthy Sister    Healthy Brother    Healthy Brother    Healthy Brother    Congenital heart disease Daughter        pacemaker   Hypertension Maternal Grandmother    Diabetes Maternal Grandmother    Asthma Neg Hx  Cancer Neg Hx    Stroke Neg Hx    Past Surgical History:  Procedure Laterality Date   CESAREAN SECTION  2021   WISDOM TOOTH EXTRACTION Bilateral    Social History   Social History Narrative   Not on file    There is no immunization history on file for this patient.   Objective: Vital Signs: BP 114/71 (BP Location: Left Arm, Patient Position: Sitting, Cuff Size: Normal)   Pulse 94   Resp 18   Ht _0  (1.702 m)   Wt 173 lb 3.2 oz  (78.6 kg)   LMP 04/15/2021 (Exact Date)   BMI 27.13 kg/m    Physical Exam Vitals and nursing note reviewed.  Constitutional:      Appearance: She is well-developed.  HENT:     Head: Normocephalic and atraumatic.  Eyes:     Conjunctiva/sclera: Conjunctivae normal.  Cardiovascular:     Rate and Rhythm: Normal rate and regular rhythm.     Heart sounds: Normal heart sounds.  Pulmonary:     Effort: Pulmonary effort is normal.     Breath sounds: Normal breath sounds.  Abdominal:     General: Bowel sounds are normal.     Palpations: Abdomen is soft.  Musculoskeletal:     Cervical back: Normal range of motion.  Skin:    General: Skin is warm and dry.     Capillary Refill: Capillary refill takes less than 2 seconds.  Neurological:     Mental Status: She is alert and oriented to person, place, and time.  Psychiatric:        Behavior: Behavior normal.      Musculoskeletal Exam: C-spine, thoracic spine, lumbar spine have good range of motion.  Some tenderness over the left SI joint.  No midline spinal tenderness.  Shoulder joints, elbow joints, wrist joints, MCPs, PIPs, DIPs have good range of motion with no synovitis.  Complete fist formation bilaterally.  Some prominence over DIP joints noted.  Hip joints have good range of motion with no groin pain.  Knee joints have good range of motion with no warmth or effusion.  Ankle joints have good range of motion with no tenderness or joint swelling.  No tenderness or synovitis over MTP joints.  CDAI Exam: CDAI Score: -- Patient Global: --; Provider Global: -- Swollen: --; Tender: -- Joint Exam 10/11/2021   No joint exam has been documented for this visit   There is currently no information documented on the homunculus. Go to the Rheumatology activity and complete the homunculus joint exam.  Investigation: No additional findings.  Imaging: [place] MFM OB FOLLOW UP  Result Date:  09/29/2021 ----------------------------------------------------------------------  OBSTETRICS REPORT                       (Signed Final 09/29/2021 09:39 am) ---------------------------------------------------------------------- Patient Info  ID #:       [id number]                          D.O.B.:  August 09, 1995 (25 yrs)  Name:       Cheryl Davila                    Visit Date: 09/29/2021 08:03 am ---------------------------------------------------------------------- Performed By  Attending:        Johnell Comings MD         Ref. Address:     [address]  [place], [place]  Performed By:     Vance Peper BS,      Location[facility]         [facility]                                [facility]                                                             [place] [facility]                                                             [facility]  Referred By:      [facility] ---------------------------------------------------------------------- Orders  #  Description                           Code        Ordered By  1  [place] MFM OB FOLLOW UP                   [phone]    Sander Nephew ----------------------------------------------------------------------  #  Order #                     Accession #                Episode #  1  [id number]                   [id number]                 [id number] ---------------------------------------------------------------------- Indications  Systemic lupus complicating pregnancy,         O26.892, M32.9  second trimester  LR NIPS, 11.8%  Horizon Neg  [redacted] weeks gestation of pregnancy                Z3A.26 ---------------------------------------------------------------------- Fetal Evaluation  Num Of Fetuses:         1  Fetal Heart Rate(bpm):  135  Cardiac Activity:        Observed  Presentation:  Cephalic  Placenta:               Posterior Fundal  P. Cord Insertion:      Previously Visualized  Amniotic Fluid  AFI FV:      Within normal limits                              Largest Pocket(cm)                              3.88 ---------------------------------------------------------------------- Biometry  BPD:      67.7  mm     G. Age:  27w 2d         73  %    CI:        72.97   %    70 - 86                                                          FL/HC:      20.1   %    18.6 - 20.4  HC:    251.96   mm     G. Age:  27w 3d         21  %    HC/AC:      1.09        1.04 - 1.22  AC:    230.13   mm     G. Age:  27w 3d         75  %    FL/BPD:     74.7   %    71 - 87  FL:       50.6  mm     G. Age:  27w 1d         62  %    FL/AC:      22.0   %    20 - 24  LV:        4.6  mm  Est. FW:    1053  gm      2 lb 5 oz     80  % ---------------------------------------------------------------------- OB History  Gravidity:    2         Term:   1        Prem:   0        SAB:   0  TOP:          0       Ectopic:  0        Living: 1 ---------------------------------------------------------------------- Gestational Age  LMP:           23w 6d        Date:  04/15/21                  EDD:   01/20/22  U/S Today:     27w 2d                                        EDD:   12/27/21  Best:          Vicenta Dunning 2d  Det. By:  Loman Chroman         EDD:   01/03/22                                      (06/04/21) ---------------------------------------------------------------------- Anatomy  Cranium:               Appears normal         LVOT:                   Previously seen  Cavum:                 Appears normal         Aortic Arch:            Previously seen  Ventricles:            Appears normal         Ductal Arch:            Previously seen  Choroid Plexus:        Previously seen        Diaphragm:              Previously seen  Cerebellum:            Previously seen        Stomach:                Appears normal,  left                                                                        sided  Posterior Fossa:       Previously seen        Abdomen:                Appears normal  Nuchal Fold:           Not applicable (>29    Abdominal Wall:         Previously seen                         wks GA)  Face:                  Orbits and profile     Cord Vessels:           Previously seen                         previously seen  Lips:                  Appears normal         Kidneys:                Appear normal  Palate:                Appears normal         Bladder:                Appears normal  Thoracic:  Appears normal         Spine:                  Not well visualized  Heart:                 Appears normal         Upper Extremities:      Appears normal                         (4CH, axis, and                         situs)  RVOT:                  Previously seen        Lower Extremities:      Appears normal  Other:  Fetus appears to be a female. Heels and 5th digit visualized. Nasal          bone previously seen. VC, 3VV and 3VTV visualized. Technically          difficult due to fetal position. ---------------------------------------------------------------------- Cervix Uterus Adnexa  Cervix  Not visualized (advanced GA >24wks) ---------------------------------------------------------------------- Comments  This patient was seen for a follow up growth scan due to  maternal lupus.  Her prior child was born with a third degree  heart block with a heart rate in the 40s to 50s range.  That  child is doing well today following the placement of a  pacemaker.  She denies any problems since her last exam.  She has a fetal echocardiogram scheduled with [facility]  pediatric cardiology in 2 weeks.  She was informed that the fetal growth and amniotic fluid  level appears appropriate for her gestational age.  The fetal heart rate was in the 130-150s range today.  There  were no signs of a congenital heart block noted .  A follow up exam  was scheduled in 4 weeks.  Due to maternal lupus, we will start weekly fetal testing at 32  weeks. ----------------------------------------------------------------------                   Johnell Comings, MD Electronically Signed Final Report   09/29/2021 09:39 am ----------------------------------------------------------------------   Recent Labs: Lab Results  Component Value Date   WBC 11.3 (H) 08/08/2021   HGB 10.7 (L) 08/08/2021   PLT 319 08/08/2021   NA 133 (L) 08/18/2021   K 3.7 08/18/2021   CL 103 08/18/2021   CO2 21 08/18/2021   GLUCOSE 84 08/18/2021   BUN 7 08/18/2021   CREATININE 0.55 08/18/2021   BILITOT 0.3 08/18/2021   AST 13 08/18/2021   ALT 11 08/18/2021   PROT 7.4 08/18/2021   CALCIUM 8.8 08/18/2021    Speciality Comments: PLQ Eye Exam: 09/27/2021 WNL @ [facility] Follow up in 1 year  Procedures:  No procedures performed Allergies: Patient has no known allergies.       Assessment / Plan:     Visit Diagnoses: Other systemic lupus erythematosus with other organ involvement (HCC) - Positive ANA, positive SSA, positive lupus anticoagulant, history of fatigue, joint pain, chest tightness, fetal congenital heart block 2021: Patient is currently [redacted] weeks pregnant.  Patient was evaluated by maternal-fetal medicine Dr. Gertie Exon on 09/01/2021 and the office visit note was reviewed today in the office.  She has been referred to Dr. Renie Ora for fetal echocardiogram--scheduled on  10/13/21.  There is also discussion of weekly fetal auscultation in clinic 24 through 36 weeks, serial growth ultrasounds, and comanagement with pediatric cardiology. Her next scheduled OB appointment is on 10/20/21.  She remains on Plaquenil 200 mg 1 tablet by mouth twice daily.  She is tolerating Plaquenil without any side effects and has not missed any doses recently.  She has not had any signs or symptoms of a systemic lupus flare.  She has occasional rashes and fatigue but has no synovitis on  examination today.  She has not had any recent rashes or signs of alopecia.  She has chronic dry mouth but has not had any symptoms of dry eyes.  She has occasional on her tongue but no nasal ulcerations.  She has not had any shortness of breath, pleuritic chest pain, palpitations.  Her lungs were clear to auscultation today. Lab work from 08/18/2021 was reviewed today in the office: Lupus anticoagulant not detected, ESR 48, complements within normal limits, and double-stranded DNA negative.  Reviewed results with the patient today in the office.  She remains on aspirin 81 mg daily.  No VTE.  No excessive bruising.  She will return for lab work in September so the future orders were placed today.  She will remain on the current dose of Plaquenil as prescribed along with aspirin 81 mg daily.  She was advised to notify us if she develops signs or symptoms of a flare.  She will follow-up in the office in 2 months or sooner if needed. - Plan: CBC with Differential/Platelet, COMPLETE METABOLIC PANEL WITH GFR, Protein / creatinine ratio, urine, Anti-DNA antibody, double-stranded, C3 and C4, Sedimentation rate  Lupus anticoagulant positive - History of positive lupus anticoagulant.  Lupus anticoagulant was not detected on 08/18/2021.  She is currently taking aspirin 81 mg daily.  No excessive bruising.  No VTE.  High risk medication use - Plaquenil-started in early April 2023.  She is currently taking Plaquenil 200 mg 1 tablet by mouth twice daily as prescribed.   Tolerating plaquenil without any side effects.  No missed doses.  Plaquenil remains safe during pregnancy and while breastfeeding.  Future orders for CBC and CMP were placed today.  She will return for updated lab work in 1 month. - Plan: CBC with Differential/Platelet, COMPLETE METABOLIC PANEL WITH GFR PLQ Eye Exam: 09/27/2021 WNL @ [facility] Follow up in 1 year.  No new medical conditions No recent infections.   Pain in both hands: She  has no joint tenderness or synovitis on examination.  Complete fist formation bilaterally.  She will remain on plaquenil as prescribed.   Other fatigue: Stable.  This past weekend she celebrated her birthday as well as her daughters.  Today.  She woke up this morning with some increased arthralgias and fatigue which she attributes to an increase in activity this past weekend.  Discussed the importance of restorative sleep and regular exercise.  Heart block, fetal, affecting care of mother: Her daughter has a pacemaker. She has establish care with maternal-fetal medicine on 09/01/2021.  Reviewed the office visit note from Dr. Gertie Exon.  She is scheduled for a fetal echocardiogram on 10/13/2021. She plans on remaining on Plaquenil as prescribed.  Orders: Orders Placed This Encounter  Procedures   CBC with Differential/Platelet   COMPLETE METABOLIC PANEL WITH GFR   Protein / creatinine ratio, urine   Anti-DNA antibody, double-stranded   C3 and C4   Sedimentation rate   No orders of the defined types  were placed in this encounter.   Follow-Up Instructions: Return in about 2 months (around 12/11/2021) for Systemic lupus erythematosus.   Ofilia Neas, PA-C  Note - This record has been created using Dragon software.  Chart creation errors have been sought, but may not always  have been located. Such creation errors do not reflect on  the standard of medical care.

## 2021-09-29 ENCOUNTER — Encounter: Payer: Self-pay | Admitting: *Deleted

## 2021-09-29 ENCOUNTER — Other Ambulatory Visit: Payer: Self-pay | Admitting: *Deleted

## 2021-09-29 ENCOUNTER — Ambulatory Visit: Payer: Medicaid Other | Attending: Obstetrics and Gynecology

## 2021-09-29 ENCOUNTER — Ambulatory Visit: Payer: Medicaid Other | Admitting: *Deleted

## 2021-09-29 VITALS — BP 128/63 | HR 78

## 2021-09-29 DIAGNOSIS — O09299 Supervision of pregnancy with other poor reproductive or obstetric history, unspecified trimester: Secondary | ICD-10-CM

## 2021-09-29 DIAGNOSIS — O99112 Other diseases of the blood and blood-forming organs and certain disorders involving the immune mechanism complicating pregnancy, second trimester: Secondary | ICD-10-CM | POA: Insufficient documentation

## 2021-09-29 DIAGNOSIS — O99891 Other specified diseases and conditions complicating pregnancy: Secondary | ICD-10-CM

## 2021-09-29 DIAGNOSIS — M329 Systemic lupus erythematosus, unspecified: Secondary | ICD-10-CM | POA: Insufficient documentation

## 2021-09-29 DIAGNOSIS — O34219 Maternal care for unspecified type scar from previous cesarean delivery: Secondary | ICD-10-CM | POA: Diagnosis present

## 2021-09-29 DIAGNOSIS — D6862 Lupus anticoagulant syndrome: Secondary | ICD-10-CM | POA: Diagnosis present

## 2021-09-29 DIAGNOSIS — Z3A26 26 weeks gestation of pregnancy: Secondary | ICD-10-CM | POA: Diagnosis not present

## 2021-10-05 ENCOUNTER — Encounter: Payer: Self-pay | Admitting: Obstetrics & Gynecology

## 2021-10-11 ENCOUNTER — Encounter: Payer: Self-pay | Admitting: Physician Assistant

## 2021-10-11 ENCOUNTER — Ambulatory Visit: Payer: Medicaid Other | Attending: Physician Assistant | Admitting: Physician Assistant

## 2021-10-11 VITALS — BP 114/71 | HR 94 | Resp 18 | Ht 67.0 in | Wt 173.2 lb

## 2021-10-11 DIAGNOSIS — M79642 Pain in left hand: Secondary | ICD-10-CM

## 2021-10-11 DIAGNOSIS — M3219 Other organ or system involvement in systemic lupus erythematosus: Secondary | ICD-10-CM | POA: Diagnosis not present

## 2021-10-11 DIAGNOSIS — Z79899 Other long term (current) drug therapy: Secondary | ICD-10-CM | POA: Diagnosis not present

## 2021-10-11 DIAGNOSIS — M79641 Pain in right hand: Secondary | ICD-10-CM | POA: Diagnosis not present

## 2021-10-11 DIAGNOSIS — R5383 Other fatigue: Secondary | ICD-10-CM

## 2021-10-11 DIAGNOSIS — R760 Raised antibody titer: Secondary | ICD-10-CM | POA: Diagnosis not present

## 2021-10-11 DIAGNOSIS — O35BXX0 Maternal care for other (suspected) fetal abnormality and damage, fetal cardiac anomalies, not applicable or unspecified: Secondary | ICD-10-CM

## 2021-10-11 NOTE — Patient Instructions (Signed)
Standing Labs We placed an order today for your standing lab work.   Please have your standing labs drawn in September and every 3 months   If possible, please have your labs drawn 2 weeks prior to your appointment so that the provider can discuss your results at your appointment.  Please note that you may see your imaging and lab results in MyChart before we have reviewed them. We may be awaiting multiple results to interpret others before contacting you. Please allow our office up to 72 hours to thoroughly review all of the results before contacting the office for clarification of your results.  We currently have open lab daily: Monday through Thursday from 1:30 PM-4:30 PM and Friday from 1:30 PM- 4:00 PM If possible, please come for your lab work on Monday, Thursday or Friday afternoons, as you may experience shorter wait times.   Effective December 21, 2021 the new lab hours will change to: Monday through Thursday from 1:30 PM-5:00 PM and Friday from 8:30 AM-12:00 PM If possible, please come for your lab work on Monday and Thursday afternoons, as you may experience shorter wait times.  Please be advised, all patients with office appointments requiring lab work will take precedent over walk-in lab work.    The office is located at [address], [place], [place] [address] No appointment is necessary.   Labs are drawn by [facility]. Please bring your co-pay at the time of your lab draw.  You may receive a bill from [facility] for your lab work.  Please note if you are on Hydroxychloroquine and and an order has been placed for a Hydroxychloroquine level, you will need to have it drawn 4 hours or more after your last dose.  If you wish to have your labs drawn at another location, please call the office 24 hours in advance to send orders.  If you have any questions regarding directions or hours of operation,  please call [phone].   As a reminder, please drink plenty of water prior  to coming for your lab work. Thanks!

## 2021-10-13 ENCOUNTER — Other Ambulatory Visit: Payer: Self-pay

## 2021-10-13 DIAGNOSIS — O0990 Supervision of high risk pregnancy, unspecified, unspecified trimester: Secondary | ICD-10-CM

## 2021-10-17 ENCOUNTER — Other Ambulatory Visit (HOSPITAL_COMMUNITY)
Admission: RE | Admit: 2021-10-17 | Discharge: 2021-10-17 | Disposition: A | Discharge status: Home / Self Care | Payer: Medicaid Other | Source: Ambulatory Visit | Attending: Family Medicine | Admitting: Family Medicine

## 2021-10-17 ENCOUNTER — Other Ambulatory Visit: Payer: Self-pay

## 2021-10-17 ENCOUNTER — Ambulatory Visit (INDEPENDENT_AMBULATORY_CARE_PROVIDER_SITE_OTHER): Payer: Medicaid Other | Admitting: Lactation Services

## 2021-10-17 VITALS — Ht 67.0 in | Wt 175.2 lb

## 2021-10-17 DIAGNOSIS — N898 Other specified noninflammatory disorders of vagina: Secondary | ICD-10-CM | POA: Insufficient documentation

## 2021-10-17 MED ORDER — TERCONAZOLE 0.4 % VA CREA: 1.0000 | TOPICAL_CREAM | Freq: Every day | VAGINAL | 0 refills | Status: DC

## 2021-10-17 NOTE — Progress Notes (Signed)
Patient reports she still has vaginal itching. She was treated with Terazol for 3 days on 8/3 and Flagyl for 7 days starting 8/4.   She still has pretty consistent itching that is increasing again and no vaginal discharge or odor. Reviewed yeast infection most likely not completely treated as yeast medication was finished prior to Flagyl. Terazol sent to pharmacy per standing orders.

## 2021-10-18 LAB — CERVICOVAGINAL ANCILLARY ONLY
Bacterial Vaginitis (gardnerella): NEGATIVE
Candida Glabrata: NEGATIVE
Candida Vaginitis: POSITIVE — AB
Comment: NEGATIVE
Comment: NEGATIVE
Comment: NEGATIVE

## 2021-10-19 NOTE — Progress Notes (Signed)
Patient was assessed and managed by nursing staff during this encounter. I have reviewed the chart and agree with the documentation and plan. I have also made any necessary editorial changes.  [place] Bing, MD 10/19/2021 1:52 PM

## 2021-10-20 ENCOUNTER — Other Ambulatory Visit: Payer: Medicaid Other

## 2021-10-20 ENCOUNTER — Other Ambulatory Visit: Payer: Self-pay

## 2021-10-20 ENCOUNTER — Ambulatory Visit (INDEPENDENT_AMBULATORY_CARE_PROVIDER_SITE_OTHER): Payer: Medicaid Other | Admitting: Family Medicine

## 2021-10-20 VITALS — BP 118/76 | HR 86 | Wt 175.3 lb

## 2021-10-20 DIAGNOSIS — O0990 Supervision of high risk pregnancy, unspecified, unspecified trimester: Secondary | ICD-10-CM | POA: Diagnosis not present

## 2021-10-20 DIAGNOSIS — Z98891 History of uterine scar from previous surgery: Secondary | ICD-10-CM

## 2021-10-20 DIAGNOSIS — Z23 Encounter for immunization: Secondary | ICD-10-CM

## 2021-10-20 DIAGNOSIS — M329 Systemic lupus erythematosus, unspecified: Secondary | ICD-10-CM

## 2021-10-20 NOTE — Progress Notes (Signed)
   PRENATAL VISIT NOTE  Subjective:  Cheryl Davila is a 26 y.o. G2P1001 at [redacted]w[redacted]d being seen today for ongoing prenatal care.  She is currently monitored for the following issues for this high-risk pregnancy and has Biological false positive RPR test; Supervision of high risk pregnancy, antepartum; SLE (systemic lupus erythematosus related syndrome) (HCC); and H/O cesarean section on their problem list.  Patient reports no complaints.  Contractions: Not present. Vag. Bleeding: None.  Movement: Present. Denies leaking of fluid.   The following portions of the patient's history were reviewed and updated as appropriate: allergies, current medications, past family history, past medical history, past social history, past surgical history and problem list.   Objective:   Vitals:   10/20/21 0837  BP: 118/76  Pulse: 86  Weight: 175 lb 4.8 oz (79.5 kg)    Fetal Status: Fetal Heart Rate (bpm): 152   Movement: Present     General:  Alert, oriented and cooperative. Patient is in no acute distress.  Skin: Skin is warm and dry. No rash noted.   Cardiovascular: Normal heart rate noted  Respiratory: Normal respiratory effort, no problems with respiration noted  Abdomen: Soft, gravid, appropriate for gestational age.  Pain/Pressure: Absent     Pelvic: Cervical exam deferred        Extremities: Normal range of motion.     Mental Status: Normal mood and affect. Normal behavior. Normal judgment and thought content.   Assessment and Plan:  Pregnancy: G2P1001 at [redacted]w[redacted]d 1. Supervision of high risk pregnancy, antepartum 2hr GTT today Tdap today Discussed transition to q 2 week appts  2. H/O cesarean section TOLAC signed today  3. SLE (systemic lupus erythematosus related syndrome) (HCC) Stable  Regular growth [place] scheduled already Ante testing starting at 32 weeks IOL at 39wks  Preterm labor symptoms and general obstetric precautions including but not limited to vaginal bleeding, contractions,  leaking of fluid and fetal movement were reviewed in detail with the patient. Please refer to After Visit Summary for other counseling recommendations.   Return in about 2 weeks (around 11/03/2021) for Routine prenatal care.  Future Appointments  Date Time Provider Department Center  10/20/2021  9:30 AM [facility] LAB [facility]  10/27/2021  9:15 AM [facility] NURSE [facility]  10/27/2021  9:30 AM [facility] US3 [facility]  11/10/2021  8:30 AM [facility] NURSE [facility]  11/10/2021  8:45 AM [facility] US5 [facility]  12/12/2021  8:40 AM Gearldine Bienenstock, PA-C [facility] None    Federico Flake, MD

## 2021-10-21 LAB — CBC
Hematocrit: 31.9 % — ABNORMAL LOW (ref 34.0–46.6)
Hemoglobin: 10.3 g/dL — ABNORMAL LOW (ref 11.1–15.9)
MCH: 24.5 pg — ABNORMAL LOW (ref 26.6–33.0)
MCHC: 32.3 g/dL (ref 31.5–35.7)
MCV: 76 fL — ABNORMAL LOW (ref 79–97)
Platelets: 249 10*3/uL (ref 150–450)
RBC: 4.2 x10E6/uL (ref 3.77–5.28)
RDW: 17.5 % — ABNORMAL HIGH (ref 11.7–15.4)
WBC: 9.4 10*3/uL (ref 3.4–10.8)

## 2021-10-21 LAB — HIV ANTIBODY (ROUTINE TESTING W REFLEX): HIV Screen 4th Generation wRfx: NONREACTIVE

## 2021-10-21 LAB — GLUCOSE TOLERANCE, 2 HOURS W/ 1HR
Glucose, 1 hour: 110 mg/dL (ref 70–179)
Glucose, 2 hour: 108 mg/dL (ref 70–152)
Glucose, Fasting: 74 mg/dL (ref 70–91)

## 2021-10-21 LAB — RPR: RPR Ser Ql: NONREACTIVE

## 2021-10-27 ENCOUNTER — Other Ambulatory Visit: Payer: Self-pay | Admitting: *Deleted

## 2021-10-27 ENCOUNTER — Ambulatory Visit: Payer: Medicaid Other | Admitting: *Deleted

## 2021-10-27 ENCOUNTER — Ambulatory Visit: Payer: Medicaid Other | Attending: Obstetrics

## 2021-10-27 VITALS — BP 118/64 | HR 79

## 2021-10-27 DIAGNOSIS — O99891 Other specified diseases and conditions complicating pregnancy: Secondary | ICD-10-CM

## 2021-10-27 DIAGNOSIS — M329 Systemic lupus erythematosus, unspecified: Secondary | ICD-10-CM

## 2021-10-27 DIAGNOSIS — O34219 Maternal care for unspecified type scar from previous cesarean delivery: Secondary | ICD-10-CM

## 2021-10-27 DIAGNOSIS — O09299 Supervision of pregnancy with other poor reproductive or obstetric history, unspecified trimester: Secondary | ICD-10-CM | POA: Diagnosis not present

## 2021-10-27 DIAGNOSIS — O26892 Other specified pregnancy related conditions, second trimester: Secondary | ICD-10-CM | POA: Diagnosis not present

## 2021-10-27 DIAGNOSIS — Z3689 Encounter for other specified antenatal screening: Secondary | ICD-10-CM

## 2021-10-27 DIAGNOSIS — Z3A30 30 weeks gestation of pregnancy: Secondary | ICD-10-CM

## 2021-11-03 ENCOUNTER — Ambulatory Visit (INDEPENDENT_AMBULATORY_CARE_PROVIDER_SITE_OTHER): Payer: Medicaid Other | Admitting: Advanced Practice Midwife

## 2021-11-03 ENCOUNTER — Other Ambulatory Visit: Payer: Self-pay

## 2021-11-03 VITALS — BP 116/69 | HR 83

## 2021-11-03 DIAGNOSIS — Z98891 History of uterine scar from previous surgery: Secondary | ICD-10-CM

## 2021-11-03 DIAGNOSIS — O361990 Maternal care for other isoimmunization, unspecified trimester, not applicable or unspecified: Secondary | ICD-10-CM

## 2021-11-03 DIAGNOSIS — Z3A31 31 weeks gestation of pregnancy: Secondary | ICD-10-CM

## 2021-11-03 DIAGNOSIS — M329 Systemic lupus erythematosus, unspecified: Secondary | ICD-10-CM

## 2021-11-03 DIAGNOSIS — O361930 Maternal care for other isoimmunization, third trimester, not applicable or unspecified: Secondary | ICD-10-CM

## 2021-11-03 DIAGNOSIS — O0990 Supervision of high risk pregnancy, unspecified, unspecified trimester: Secondary | ICD-10-CM

## 2021-11-03 DIAGNOSIS — Z8249 Family history of ischemic heart disease and other diseases of the circulatory system: Secondary | ICD-10-CM

## 2021-11-03 NOTE — Progress Notes (Unsigned)
   PRENATAL VISIT NOTE  Subjective:  Cheryl Davila is a 26 y.o. G2P1001 at [redacted]w[redacted]d being seen today for ongoing prenatal care.  She is currently monitored for the following issues for this high-risk pregnancy and has Biological false positive RPR test; Supervision of high risk pregnancy, antepartum; SLE (systemic lupus erythematosus related syndrome) (HCC); and H/O cesarean section on their problem list.  Patient reports {sx:14538}.   .  .   . Denies leaking of fluid.   The following portions of the patient's history were reviewed and updated as appropriate: allergies, current medications, past family history, past medical history, past social history, past surgical history and problem list.   Objective:  There were no vitals filed for this visit.  Fetal Status:           General:  Alert, oriented and cooperative. Patient is in no acute distress.  Skin: Skin is warm and dry. No rash noted.   Cardiovascular: Normal heart rate noted  Respiratory: Normal respiratory effort, no problems with respiration noted  Abdomen: Soft, gravid, appropriate for gestational age.        Pelvic: {Blank single:19197::"Cervical exam performed in the presence of a chaperone","Cervical exam deferred"}        Extremities: Normal range of motion.     Mental Status: Normal mood and affect. Normal behavior. Normal judgment and thought content.   Assessment and Plan:  Pregnancy: G2P1001 at [redacted]w[redacted]d 1. SLE (systemic lupus erythematosus related syndrome) (HCC) - on PLaquenil  2. H/O cesarean section ***  3. Supervision of high risk pregnancy, antepartum ***  4. [redacted] weeks gestation of pregnancy ***  5. 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 Please refer to  After Visit Summary for other counseling recommendations.   No follow-ups on file.  Future Appointments  Date Time Provider Department Center  11/10/2021  8:30 AM [facility] NURSE [facility]  11/10/2021  8:45 AM [facility] US5 [facility]  11/17/2021  8:30 AM [facility] NURSE [facility]  11/17/2021  8:45 AM [facility] US5 [facility]  11/24/2021  9:45 AM [facility] NURSE [facility]  11/24/2021 10:00 AM [facility] US1 [facility]  12/12/2021  8:40 AM Gearldine Bienenstock, PA-C [facility] None    Dorathy Kinsman, CNM

## 2021-11-07 ENCOUNTER — Encounter: Payer: Self-pay | Admitting: Advanced Practice Midwife

## 2021-11-07 DIAGNOSIS — Z8249 Family history of ischemic heart disease and other diseases of the circulatory system: Secondary | ICD-10-CM | POA: Insufficient documentation

## 2021-11-07 DIAGNOSIS — O361990 Maternal care for other isoimmunization, unspecified trimester, not applicable or unspecified: Secondary | ICD-10-CM

## 2021-11-07 HISTORY — DX: Maternal care for other isoimmunization, unspecified trimester, not applicable or unspecified: O36.1990

## 2021-11-10 ENCOUNTER — Ambulatory Visit: Payer: Medicaid Other | Admitting: *Deleted

## 2021-11-10 ENCOUNTER — Ambulatory Visit: Payer: Medicaid Other | Attending: Obstetrics

## 2021-11-10 ENCOUNTER — Other Ambulatory Visit: Payer: Self-pay | Admitting: *Deleted

## 2021-11-10 VITALS — HR 81

## 2021-11-10 DIAGNOSIS — O34219 Maternal care for unspecified type scar from previous cesarean delivery: Secondary | ICD-10-CM | POA: Diagnosis not present

## 2021-11-10 DIAGNOSIS — O99891 Other specified diseases and conditions complicating pregnancy: Secondary | ICD-10-CM | POA: Diagnosis present

## 2021-11-10 DIAGNOSIS — M329 Systemic lupus erythematosus, unspecified: Secondary | ICD-10-CM

## 2021-11-10 DIAGNOSIS — O09299 Supervision of pregnancy with other poor reproductive or obstetric history, unspecified trimester: Secondary | ICD-10-CM | POA: Insufficient documentation

## 2021-11-10 DIAGNOSIS — Z3A32 32 weeks gestation of pregnancy: Secondary | ICD-10-CM | POA: Diagnosis not present

## 2021-11-17 ENCOUNTER — Ambulatory Visit (HOSPITAL_BASED_OUTPATIENT_CLINIC_OR_DEPARTMENT_OTHER): Payer: Medicaid Other | Admitting: Obstetrics

## 2021-11-17 ENCOUNTER — Ambulatory Visit: Payer: Medicaid Other | Admitting: *Deleted

## 2021-11-17 ENCOUNTER — Ambulatory Visit: Payer: Medicaid Other | Attending: Obstetrics and Gynecology

## 2021-11-17 VITALS — BP 122/64 | HR 75

## 2021-11-17 DIAGNOSIS — Z3A33 33 weeks gestation of pregnancy: Secondary | ICD-10-CM

## 2021-11-17 DIAGNOSIS — Z3689 Encounter for other specified antenatal screening: Secondary | ICD-10-CM | POA: Insufficient documentation

## 2021-11-17 DIAGNOSIS — O09293 Supervision of pregnancy with other poor reproductive or obstetric history, third trimester: Secondary | ICD-10-CM

## 2021-11-17 DIAGNOSIS — O34219 Maternal care for unspecified type scar from previous cesarean delivery: Secondary | ICD-10-CM | POA: Diagnosis not present

## 2021-11-17 DIAGNOSIS — O99891 Other specified diseases and conditions complicating pregnancy: Secondary | ICD-10-CM | POA: Insufficient documentation

## 2021-11-17 DIAGNOSIS — M329 Systemic lupus erythematosus, unspecified: Secondary | ICD-10-CM | POA: Insufficient documentation

## 2021-11-18 ENCOUNTER — Other Ambulatory Visit: Payer: Self-pay | Admitting: Physician Assistant

## 2021-11-18 NOTE — Telephone Encounter (Signed)
Next Visit: 12/12/2021  Last Visit: 10/11/2021  Labs: 08/18/2021 CMP stable. ESR remains elevated.  Patient is currently pregnant.  Complements WNL. dsDNA is negative. Lupus anticoagulant was not detected which is good news  Eye exam: 09/27/2021   Current Dose per office note on 10/11/2021: Plaquenil 200 mg 1 tablet by mouth twice daily  DX: Other systemic lupus erythematosus with other organ involvement   Last Fill: 08/18/2021  Okay to refill Plaquenil?

## 2021-11-18 NOTE — Progress Notes (Signed)
MFM Note  Cheryl Davila was seen for a follow up exam due to maternal lupus. She denies any problems since her last exam.    She was informed that the fetal growth and amniotic fluid level appears appropriate for her gestational age.  A BPP performed today was 8 out of 8.    On today's exam, a possible foramen ovale aneurysm was noted.  The foramen ovale is noted to be bulging into the left atrium.  This was not noted on her prior ultrasound exams.  The patient had a normal fetal echocardiogram earlier in her pregnancy.  Due to the finding of a possible foramen ovale aneurysm, she was referred back to pediatric cardiology for evaluation.    We will await the recommendations from pediatric cardiology regarding management of this finding.    She will return in 1 week for another BPP.  A total of 20 minutes was spent counseling and coordinating the care for this patient.  Greater than 50% of the time was spent in direct face-to-face contact.

## 2021-11-21 ENCOUNTER — Encounter: Payer: Self-pay | Admitting: Obstetrics and Gynecology

## 2021-11-21 ENCOUNTER — Ambulatory Visit (INDEPENDENT_AMBULATORY_CARE_PROVIDER_SITE_OTHER): Payer: Medicaid Other | Admitting: Obstetrics and Gynecology

## 2021-11-21 VITALS — BP 118/71 | HR 81 | Wt 180.1 lb

## 2021-11-21 DIAGNOSIS — O0990 Supervision of high risk pregnancy, unspecified, unspecified trimester: Secondary | ICD-10-CM

## 2021-11-21 DIAGNOSIS — M329 Systemic lupus erythematosus, unspecified: Secondary | ICD-10-CM

## 2021-11-21 DIAGNOSIS — Z98891 History of uterine scar from previous surgery: Secondary | ICD-10-CM

## 2021-11-21 NOTE — Patient Instructions (Signed)

## 2021-11-21 NOTE — Progress Notes (Signed)
Subjective:  Cheryl Davila is a 26 y.o. G2P1001 at [redacted]w[redacted]d being seen today for ongoing prenatal care.  She is currently monitored for the following issues for this high-risk pregnancy and has Biological false positive RPR test; Supervision of high risk pregnancy, antepartum; SLE (systemic lupus erythematosus related syndrome) ([place]); H/O cesarean section; Family history of heart block; and Maternal atypical antibody on their problem list.  Patient reports general discomforts of pregnancy.  Contractions: Not present. Vag. Bleeding: None.  Movement: Present. Denies leaking of fluid.   The following portions of the patient's history were reviewed and updated as appropriate: allergies, current medications, past family history, past medical history, past social history, past surgical history and problem list. Problem list updated.  Objective:   Vitals:   11/21/21 1526  BP: 118/71  Pulse: 81  Weight: 180 lb 1.6 oz (81.7 kg)    Fetal Status: Fetal Heart Rate (bpm): 143   Movement: Present     General:  Alert, oriented and cooperative. Patient is in no acute distress.  Skin: Skin is warm and dry. No rash noted.   Cardiovascular: Normal heart rate noted  Respiratory: Normal respiratory effort, no problems with respiration noted  Abdomen: Soft, gravid, appropriate for gestational age. Pain/Pressure: Absent     Pelvic:  Cervical exam deferred        Extremities: Normal range of motion.  Edema: None  Mental Status: Normal mood and affect. Normal behavior. Normal judgment and thought content.   Urinalysis:      Assessment and Plan:  Pregnancy: G2P1001 at [redacted]w[redacted]d  1. Supervision of high risk pregnancy, antepartum Stable  2. SLE (systemic lupus erythematosus related syndrome) (HCC) Stable Followed by Rheumatology Serial growth scans and antenatal testing as per MFM  3. H/O cesarean section For TOLAC Consented 10/20/21  4. H/O fetal heart block Stable Normal Fetal ECHO   Preterm labor  symptoms and general obstetric precautions including but not limited to vaginal bleeding, contractions, leaking of fluid and fetal movement were reviewed in detail with the patient. Please refer to After Visit Summary for other counseling recommendations.  Return in about 2 weeks (around 12/05/2021) for OB visit, face to face, MD only.   Chancy Milroy, MD

## 2021-11-24 ENCOUNTER — Ambulatory Visit: Payer: Medicaid Other | Admitting: *Deleted

## 2021-11-24 ENCOUNTER — Ambulatory Visit: Payer: Medicaid Other | Attending: Obstetrics and Gynecology

## 2021-11-24 VITALS — BP 129/73 | HR 75

## 2021-11-24 DIAGNOSIS — M329 Systemic lupus erythematosus, unspecified: Secondary | ICD-10-CM | POA: Insufficient documentation

## 2021-11-24 DIAGNOSIS — O99891 Other specified diseases and conditions complicating pregnancy: Secondary | ICD-10-CM | POA: Insufficient documentation

## 2021-11-24 DIAGNOSIS — Z3689 Encounter for other specified antenatal screening: Secondary | ICD-10-CM | POA: Diagnosis present

## 2021-11-24 DIAGNOSIS — O34219 Maternal care for unspecified type scar from previous cesarean delivery: Secondary | ICD-10-CM | POA: Diagnosis not present

## 2021-11-24 DIAGNOSIS — O35BXX0 Maternal care for other (suspected) fetal abnormality and damage, fetal cardiac anomalies, not applicable or unspecified: Secondary | ICD-10-CM

## 2021-11-24 DIAGNOSIS — Z3A34 34 weeks gestation of pregnancy: Secondary | ICD-10-CM

## 2021-11-28 NOTE — Progress Notes (Signed)
Office Visit Note  Patient: Cheryl Davila             Date of Birth: 08/16/1995           MRN: [id number]             PCP: Medicine, [facility] Referring: Medicine, [facility]* Visit Date: 12/12/2021 Occupation: @GUAROCC @  Subjective:  [redacted] weeks pregnant   History of Present Illness: Cheryl Davila is a 26 y.o. female with history of systemic lupus.  She remains on Plaquenil 200 mg 1 tablet by mouth twice daily- started in early April 2023.  She is tolerating Plaquenil without any side effects and has not missed any doses recently.  Starting tomorrow she will be [redacted] weeks pregnant.  She is unsure if she will be going to full-term or will be induced at 39 weeks.  She has not had any complications so far.  Her blood pressure has been well controlled and was 110/72 today in the office.  She remains on aspirin 81 mg daily. She denies any signs or symptoms of a systemic lupus flare.  She has not had any recent rashes, hair loss, photosensitivity, Raynaud's phenomenon, shortness of breath, pleuritic chest pain.  She denies any swollen lymph nodes.  She states that she had a recent upper respiratory tract infection and was coughing for 2+ days.  She had interrupted sleep at night due to the cough which has since gotten better.  She continues to have ongoing fatigue.  She states that about 1 week ago she was experiencing increased aching which has since resolved.  She denies any joint swelling.  She has occasional sores on her tongue but denies any nasal ulcerations. She has chronic mouth dryness but no eye dryness.     Activities of Daily Living:  Patient reports morning stiffness for 0 minutes.   Patient Denies nocturnal pain.  Difficulty dressing/grooming: Denies Difficulty climbing stairs: Denies Difficulty getting out of chair: Denies Difficulty using hands for taps, buttons, cutlery, and/or writing: Reports  Review of Systems  Constitutional:  Negative for fatigue.  HENT:   Positive for mouth sores and mouth dryness. Negative for nose dryness.   Eyes:  Negative for pain, visual disturbance and dryness.  Respiratory:  Negative for cough, hemoptysis and difficulty breathing.   Cardiovascular:  Negative for chest pain, palpitations, hypertension and swelling in legs/feet.  Gastrointestinal:  Negative for blood in stool, constipation and diarrhea.  Endocrine: Negative for increased urination.  Genitourinary:  Negative for painful urination and involuntary urination.  Musculoskeletal:  Positive for muscle weakness. Negative for joint pain, gait problem, joint pain, joint swelling, myalgias, morning stiffness, muscle tenderness and myalgias.  Skin:  Negative for color change, pallor, rash, hair loss, nodules/bumps, skin tightness, ulcers and sensitivity to sunlight.  Allergic/Immunologic: Negative for susceptible to infections.  Neurological:  Negative for dizziness, numbness, headaches and weakness.  Hematological:  Positive for swollen glands.  Psychiatric/Behavioral:  Positive for sleep disturbance. Negative for depressed mood. The patient is not nervous/anxious.     PMFS History:  Patient Active Problem List   Diagnosis Date Noted  . Family history of heart block 11/07/2021  . 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 Supervision of high risk pregnancy, antepartum 08/08/2021  . SLE (systemic lupus erythematosus related syndrome) (HCC) 08/08/2021  . H/O cesarean section 08/08/2021  . 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 Lupus (HCC)   . 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 Depression Mother   . Healthy Sister   . Healthy Sister   . Healthy Brother   . Healthy Brother   . Healthy Brother   . Congenital heart disease Daughter        pacemaker  . Hypertension Maternal Grandmother   . Diabetes Maternal Grandmother   . Asthma Neg Hx   . Cancer Neg  Hx   . Stroke Neg Hx    Past Surgical History:  Procedure Laterality Date  . CESAREAN SECTION  2021  . WISDOM TOOTH EXTRACTION Bilateral    Social History   Social History Narrative  . Not on file   Immunization History  Administered Date(s) Administered  . Tdap 08/29/2019, 10/20/2021     Objective: Vital Signs: BP 110/72 (BP Location: Left Arm, Patient Position: Sitting, Cuff Size: Normal)   Pulse 96   Resp 14   Ht  (1.702 m)   Wt 183 lb 9.6 oz (83.3 kg)   LMP 04/15/2021 (Exact Date)   BMI 28.76 kg/m    Physical Exam Vitals and nursing note reviewed.  Constitutional:      Appearance: She is well-developed.  HENT:     Head: Normocephalic and atraumatic.  Eyes:     Conjunctiva/sclera: Conjunctivae normal.  Cardiovascular:     Rate and Rhythm: Normal rate and regular rhythm.     Heart sounds: Normal heart sounds.  Pulmonary:     Effort: Pulmonary effort is normal.     Breath sounds: Normal breath sounds.  Abdominal:     General: Bowel sounds are normal.     Palpations: Abdomen is soft.  Musculoskeletal:     Cervical back: Normal range of motion.  Skin:    General: Skin is warm and dry.     Capillary Refill: Capillary refill takes less than 2 seconds.  Neurological:     Mental Status: She is alert and oriented to person, place, and time.  Psychiatric:        Behavior: Behavior normal.     Musculoskeletal Exam: C-spine, thoracic spine, lumbar spine have good range of motion with no discomfort.  No midline spinal tenderness.  No SI joint tenderness.  Shoulder joints, elbow joints, wrist joints, MCPs, PIPs, DIPs have good range of motion with no synovitis.  Complete fist formation bilaterally.  Hip joints have good range of motion with no groin pain.  Knee joints have good range of motion with no warmth or effusion.  Ankle joints have good range of motion with no tenderness or joint swelling.  CDAI Exam: CDAI Score: -- Patient Global: --; Provider Global:  -- Swollen: --; Tender: -- Joint Exam 12/12/2021   No joint exam has been documented for this visit   There is currently no information documented on the homunculus. Go to the Rheumatology activity and complete the homunculus joint exam.  Investigation: No additional findings.  Imaging: [place] MFM FETAL BPP WO NON STRESS  Result Date: 12/08/2021 ----------------------------------------------------------------------  OBSTETRICS REPORT                       (Signed Final 12/08/2021 11:47 am) ---------------------------------------------------------------------- Patient Info  ID #:       [id number]                          D.O.B.:  April 24, 1995 (26 yrs)  Name:  Cheryl Davila                    Visit Date: 12/08/2021 10:13 am ---------------------------------------------------------------------- Performed By  Attending:        Noralee Space MD        Ref. Address:     [address]                                                             [place], [place]                                                             [address]  Performed By:     Emeline Darling BS,      [facility]                                     [facility]  Referred By:      [facility] ---------------------------------------------------------------------- Orders  #  Description                           Code        Ordered By  1  [place] MFM FETAL BPP WO NON               76819.01    [facility]     STRESS ----------------------------------------------------------------------  #  Order #                     Accession #                Episode #  1  [id number]                   [id number]                 [id number] ---------------------------------------------------------------------- Indications  Systemic lupus complicating  pregnancy,         O26.893, M32.9  third trimester  aneurysm Fetal or maternal indication (FO      O35.8XX0  aneursym)  History of cesarean delivery, currently        O34.219  pregnant  [redacted] weeks gestation of pregnancy                Z3A.36  Low Risk NIPS(Negative Horizon) ---------------------------------------------------------------------- Vital Signs  BP:          105/68 ---------------------------------------------------------------------- Fetal Evaluation  Num Of Fetuses:         1  Fetal Heart Rate(bpm):  136  Cardiac Activity:       Observed  Presentation:           Cephalic  Placenta:               Posterior Fundal  P. Cord Insertion:      Previously Visualized  Amniotic Fluid  AFI FV:      Within normal limits  AFI Sum(cm)     %Tile       Largest Pocket(cm)  15.9            59          7.3  RUQ(cm)       RLQ(cm)       LUQ(cm)        LLQ(cm)  3.2           1.3           7.3            4.1 ---------------------------------------------------------------------- Biophysical Evaluation  Amniotic F.V:   Pocket => 2 cm             F. Tone:        Observed  F. Movement:    Observed                   Score:          8/8  F. Breathing:   Observed ---------------------------------------------------------------------- OB History  Gravidity:    2         Term:   1        Prem:   0        SAB:   0  TOP:          0       Ectopic:  0        Living: 1 ---------------------------------------------------------------------- Gestational Age  LMP:           33w 6d        Date:  04/15/21                  EDD:   01/20/22  Best:          Cheryl Davila 2d     Det. ByMarcella Dubs         EDD:   01/03/22                                      (06/04/21) ---------------------------------------------------------------------- Anatomy  Ventricles:            Appears normal         Stomach:                Appears normal, left  sided  Heart:                 Appears normal          Kidneys:                Appear normal                         (4CH, axis, and                         situs)  Diaphragm:             Appears normal         Bladder:                Appears normal ---------------------------------------------------------------------- Impression  SLE. Fetal echocardiography was reported as normal.  Amniotic fluid is normal and good fetal activity is seen  .Antenatal testing is reassuring. BPP 8/8. Cephalic  presentation. ---------------------------------------------------------------------- Recommendations  -Continue weekly BPP till delivery. ----------------------------------------------------------------------                  Noralee Space, MD Electronically Signed Final Report   12/08/2021 11:47 am ----------------------------------------------------------------------  [place] MFM FETAL BPP WO NON STRESS  Result Date: 12/01/2021 ----------------------------------------------------------------------  OBSTETRICS REPORT                       (Signed Final 12/01/2021 11:40 am) ---------------------------------------------------------------------- Patient Info  ID #:       [id number]                          D.O.B.:  12/19/1995 (26 yrs)  Name:       Cheryl Davila                    Visit Date: 12/01/2021 08:37 am ---------------------------------------------------------------------- Performed By  Attending:        Braxton Feathers DO       Ref. Address:     [address]                                                             [place], [place]                                                             [address]  Performed By:     Anabel Halon          Location[facility]                                     [facility]  Referred By:      [facility]  ---------------------------------------------------------------------- Orders  #  Description                           Code        Ordered By  1  [place] MFM FETAL BPP WO NON               76819.01    Cheryl Davila     STRESS  2  [place] MFM UA CORD DOPPLER                [id number]    Cheryl Davila ----------------------------------------------------------------------  #  Order #                     Accession #                Episode #  1  [id number]                   [id number]                 [id number]  2  [id number]                   [id number]                 [id number] ---------------------------------------------------------------------- Indications  aneurysm Fetal or maternal indication (FO      O35.8XX0  aneursym)  Systemic lupus complicating pregnancy,         O26.893, M32.9  third trimester  [redacted] weeks gestation of pregnancy                Z3A.35  History of cesarean delivery, currently        O34.219  pregnant  Low Risk NIPS(Negative Horizon) ---------------------------------------------------------------------- Fetal Evaluation  Num Of Fetuses:         1  Fetal Heart Rate(bpm):  134  Cardiac Activity:       Observed  Presentation:           Cephalic  Placenta:               Posterior Fundal  P. Cord Insertion:      Previously Visualized  Amniotic Fluid  AFI FV:      Within normal limits  AFI Sum(cm)     %Tile       Largest Pocket(cm)  10.5            25          4.6  RUQ(cm)       RLQ(cm)       LUQ(cm)        LLQ(cm)  4.6           2.5           3.4            0 ---------------------------------------------------------------------- Biophysical Evaluation  Amniotic F.V:   Pocket =>  2 cm             F. Tone:        Observed  F. Movement:    Observed                   Score:          8/8  F. Breathing:   Observed ---------------------------------------------------------------------- Biometry  LV:        4.3  mm ---------------------------------------------------------------------- OB History  Gravidity:    2         Term:    1        Prem:   0        SAB:   0  TOP:          0       Ectopic:  0        Living: 1 ---------------------------------------------------------------------- Gestational Age  LMP:           32w 6d        Date:  04/15/21                  EDD:   01/20/22  Best:          Consuello Closs 2d     Det. ByMarcella Dubs         EDD:   01/03/22                                      (06/04/21) ---------------------------------------------------------------------- Anatomy  Cranium:               Appears normal         Stomach:                Appears normal, left                                                                        sided  Heart:                 Redundant FO prev      Kidneys:                Appear normal                         visualized  Diaphragm:             Appears normal         Bladder:                Appears normal ---------------------------------------------------------------------- Doppler - Fetal Vessels  Umbilical Artery   S/D     %tile      RI    %tile      PI    %tile     PSV    ADFV    RDFV                                                     (  cm/s)    2.9       77     0.6       60       1       79     54.5      No      No ---------------------------------------------------------------------- Cervix Uterus Adnexa  Cervix  Not visualized (advanced GA >24wks)  Uterus  No abnormality visualized.  Right Ovary  Within normal limits.  Left Ovary  Within normal limits.  Cul De Sac  No free fluid seen.  Adnexa  No abnormality visualized. ---------------------------------------------------------------------- Comments  The patient is here for a BPP for a history of FO aneursym  and SLE. She had a fetal echo and it was confirmed. Per  discussion with peds cards and [employer], the patient does  not need further antenatal tesitng for this. But due to SLE she  will have continued growth and antenatal testing. She is at  35w 2d with EDD of 01/03/2022 dated by [facility]  (06/04/21).  She has no complaints  today. She is compliant  with Plaquinel.  Sonographic findings  Single intrauterine pregnancy.  Observed fetal cardiac activity.  Cephalic presentation.  Interval fetal anatomy appears normal.  Amniotic fluid volume: Within normal limits. AFI: 10.5 cm.  MVP: 4.6 cm.  Placenta is posterior fundal.  BPP is 8/8.  Recommendations  - Continue weekly BPPs and growth [place] for SLE ----------------------------------------------------------------------                   Braxton Feathers, DO Electronically Signed Final Report   12/01/2021 11:40 am ----------------------------------------------------------------------  [place] MFM UA CORD DOPPLER  Result Date: 12/01/2021 ----------------------------------------------------------------------  OBSTETRICS REPORT                       (Signed Final 12/01/2021 11:40 am) ---------------------------------------------------------------------- Patient Info  ID #:       [id number]                          D.O.B.:  1995-06-15 (26 yrs)  Name:       Cheryl Davila                    Visit Date: 12/01/2021 08:37 am ---------------------------------------------------------------------- Performed By  Attending:        Braxton Feathers DO       Ref. Address:     [address]                                                             [place], [place]                                                             [address]  Performed By:     Anabel Halon          Location[facility]  [facility]                                     [facility]                                                             [facility]  Referred By:      [facility] ---------------------------------------------------------------------- Orders  #  Description                           Code        Ordered By  1  [place] MFM FETAL BPP WO NON               76819.01    Cheryl Davila     STRESS  2  [place] MFM UA CORD  DOPPLER                40347.42    [facility]yl [facility] ----------------------------------------------------------------------  #  Order #                     Accession #                Episode #  1  [id number]                   [id number]                 [id number]  2  [id number]                   [id number]                 [id number] ---------------------------------------------------------------------- Indications  aneurysm Fetal or maternal indication (FO      O35.8XX0  aneursym)  Systemic lupus complicating pregnancy,         O26.893, M32.9  third trimester  [redacted] weeks gestation of pregnancy                Z3A.35  History of cesarean delivery, currently        O34.219  pregnant  Low Risk NIPS(Negative Horizon) ---------------------------------------------------------------------- Fetal Evaluation  Num Of Fetuses:         1  Fetal Heart Rate(bpm):  134  Cardiac Activity:       Observed  Presentation:           Cephalic  Placenta:               Posterior Fundal  P. Cord Insertion:      Previously Visualized  Amniotic Fluid  AFI FV:      Within normal limits  AFI Sum(cm)     %Tile  Largest Pocket(cm)  10.5            25          4.6  RUQ(cm)       RLQ(cm)       LUQ(cm)        LLQ(cm)  4.6           2.5           3.4            0 ---------------------------------------------------------------------- Biophysical Evaluation  Amniotic F.V:   Pocket => 2 cm             F. Tone:        Observed  F. Movement:    Observed                   Score:          8/8  F. Breathing:   Observed ---------------------------------------------------------------------- Biometry  LV:        4.3  mm ---------------------------------------------------------------------- OB History  Gravidity:    2         Term:   1        Prem:   0        SAB:   0  TOP:          0       Ectopic:  0        Living: 1 ---------------------------------------------------------------------- Gestational Age  LMP:           32w 6d        Date:  04/15/21                   EDD:   01/20/22  Best:          Consuello Closs 2d     Det. By:  Marcella Dubs         EDD:   01/03/22                                      (06/04/21) ---------------------------------------------------------------------- Anatomy  Cranium:               Appears normal         Stomach:                Appears normal, left                                                                        sided  Heart:                 Redundant FO prev      Kidneys:                Appear normal                         visualized  Diaphragm:             Appears normal         Bladder:                Appears normal ----------------------------------------------------------------------  Doppler - Fetal Vessels  Umbilical Artery   S/D     %tile      RI    %tile      PI    %tile     PSV    ADFV    RDFV                                                     (cm/s)    2.9       77     0.6       60       1       79     54.5      No      No ---------------------------------------------------------------------- Cervix Uterus Adnexa  Cervix  Not visualized (advanced GA >24wks)  Uterus  No abnormality visualized.  Right Ovary  Within normal limits.  Left Ovary  Within normal limits.  Cul De Sac  No free fluid seen.  Adnexa  No abnormality visualized. ---------------------------------------------------------------------- Comments  The patient is here for a BPP for a history of FO aneursym  and SLE. She had a fetal echo and it was confirmed. Per  discussion with peds cards and [employer], the patient does  not need further antenatal tesitng for this. But due to SLE she  will have continued growth and antenatal testing. She is at  35w 2d with EDD of 01/03/2022 dated by [facility]  (06/04/21).  She has no complaints today. She is compliant  with Plaquinel.  Sonographic findings  Single intrauterine pregnancy.  Observed fetal cardiac activity.  Cephalic presentation.  Interval fetal anatomy appears normal.  Amniotic fluid volume: Within normal limits.  AFI: 10.5 cm.  MVP: 4.6 cm.  Placenta is posterior fundal.  BPP is 8/8.  Recommendations  - Continue weekly BPPs and growth [place] for SLE ----------------------------------------------------------------------                   Braxton Feathers, DO Electronically Signed Final Report   12/01/2021 11:40 am ----------------------------------------------------------------------  [place] MFM FETAL BPP WO NON STRESS  Result Date: 11/24/2021 ----------------------------------------------------------------------  OBSTETRICS REPORT                       (Signed Final 11/24/2021 11:02 am) ---------------------------------------------------------------------- Patient Info  ID #:       [id number]                          D.O.B.:  10-Jan-1996 (26 yrs)  Name:       Cheryl Davila                    Visit Date: 11/24/2021 10:01 am ---------------------------------------------------------------------- Performed By  Attending:        Noralee Space MD        Ref. Address:     [address]                                                             [place], [place]  [address]  Performed By:     Emeline Darling BS,      Location[facility]                                     [facility]  Referred By:      [facility] ---------------------------------------------------------------------- Orders  #  Description                           Code        Ordered By  1  [place] MFM FETAL BPP WO NON               76819.01    Cheryl [facility]     STRESS ----------------------------------------------------------------------  #  Order #                     Accession #                Episode #  1  [id number]                   [id number]                 [id number]  ---------------------------------------------------------------------- Indications  Fetal abnormality - other known or suspected   O35.9XX0  Systemic lupus complicating pregnancy,         O26.893, M32.9  third trimester  History of cesarean delivery, currently        O34.219  pregnant  Low Risk NIPS(Negative Horizon)  [redacted] weeks gestation of pregnancy                Z3A.34 ---------------------------------------------------------------------- Fetal Evaluation  Num Of Fetuses:         1  Fetal Heart Rate(bpm):  127  Cardiac Activity:       Observed  Presentation:           Cephalic  Placenta:               Posterior Fundal  P. Cord Insertion:      Previously Visualized  Amniotic Fluid  AFI FV:      Within normal limits  AFI Sum(cm)     %Tile       Largest Pocket(cm)  10.1            20  4.1  RUQ(cm)       RLQ(cm)       LUQ(cm)        LLQ(cm)  3.7           1.4           4.1            0.9 ---------------------------------------------------------------------- Biophysical Evaluation  Amniotic F.V:   Pocket => 2 cm             F. Tone:        Observed  F. Movement:    Observed                   Score:          8/8  F. Breathing:   Observed ---------------------------------------------------------------------- OB History  Gravidity:    2         Term:   1        Prem:   0        SAB:   0  TOP:          0       Ectopic:  0        Living: 1 ---------------------------------------------------------------------- Gestational Age  LMP:           31w 6d        Date:  04/15/21                  EDD:   01/20/22  Best:          34w 2d     Det. ByMarcella Dubs         EDD:   01/03/22                                      (06/04/21) ---------------------------------------------------------------------- Anatomy  Stomach:               Appears normal, left   Bladder:                Appears normal                         sided ---------------------------------------------------------------------- Impression  Systemic lupus  erythematosus.  Patient does not give history  of flares.  She takes Plaquenil.  Amniotic fluid is normal.  Cephalic presentation.  Antenatal  testing is reassuring.  BPP 8/8.  Redundant foramen ovale flap is seen bulging into the left  atrium.  Both ventricles appear normal with no ventricular  disproportion.  Normal blood flow through mitral and tricuspid  valves are seen.  I reassured the patient that redundant foramen ovale flap is a  normal variant. ---------------------------------------------------------------------- Recommendations  -Continue weekly BPP till delivery. ----------------------------------------------------------------------                 Noralee Space, MD Electronically Signed Final Report   11/24/2021 11:02 am ----------------------------------------------------------------------  [place] MFM OB FOLLOW UP  Result Date: 11/18/2021 ----------------------------------------------------------------------  OBSTETRICS REPORT                       (Signed Final 11/18/2021 10:09 am) ---------------------------------------------------------------------- Patient Info  ID #:       [id number]  D.O.B.:  01-12-1996 (26 yrs)  Name:       Cheryl Davila                    Visit Date: 11/17/2021 09:08 am ---------------------------------------------------------------------- Performed By  Attending:        Ma Rings MD         Ref. Address:     [address]                                                             [place], [place]                                                             [address]  Performed By:     Cyndie Mull          [facility]  Referred By:      [facility]  ---------------------------------------------------------------------- Orders  #  Description                           Code        Ordered By  1  [place] MFM OB FOLLOW UP                   [id number]    Cheryl Davila  2  [place] MFM FETAL BPP WO NON               60454.09    Cheryl [facility]     STRESS ----------------------------------------------------------------------  #  Order #                     Accession #                Episode #  1  [id number]  [id number]                 [id number]  2  [id number]                   [id number]                 [id number] ---------------------------------------------------------------------- Indications  Systemic lupus complicating pregnancy,         O26.893, M32.9  third trimester  History of cesarean delivery, currently        O34.219  pregnant  LR NIPS, 11.8%  [redacted] weeks gestation of pregnancy                Z3A.33  Horizon Neg ---------------------------------------------------------------------- Vital Signs  BP:          122/64 ---------------------------------------------------------------------- Fetal Evaluation  Num Of Fetuses:         1  Fetal Heart Rate(bpm):  145  Cardiac Activity:       Observed  Presentation:           Cephalic  Placenta:               Posterior Fundal  P. Cord Insertion:      Previously Visualized  Amniotic Fluid  AFI FV:      Within normal limits  AFI Sum(cm)     %Tile       Largest Pocket(cm)  11.5            29          4.2  RUQ(cm)       RLQ(cm)       LUQ(cm)        LLQ(cm)  3             4.2           2.5            1.8 ---------------------------------------------------------------------- Biophysical Evaluation  Amniotic F.V:   Pocket => 2 cm             F. Tone:        Observed  F. Movement:    Observed                   Score:          8/8  F. Breathing:   Observed ---------------------------------------------------------------------- Biometry  BPD:      90.4  mm     G. Age:  36w 4d       > 99  %    CI:        78.17   %    70 - 86                                                           FL/HC:      20.3   %    19.9 - 21.5  HC:      323.5  mm     G. Age:  36w 4d         90  %    HC/AC:      1.08        0.96 - 1.11  AC:      300.3  mm     G. Age:  60w 0d  72  %    FL/BPD:     72.6   %    71 - 87  FL:       65.6  mm     G. Age:  33w 6d         53  %    FL/AC:      21.8   %    20 - 24  Est. FW:    2435  gm      5 lb 6 oz     77  % ---------------------------------------------------------------------- OB History  Gravidity:    2         Term:   1        Prem:   0        SAB:   0  TOP:          0       Ectopic:  0        Living: 1 ---------------------------------------------------------------------- Gestational Age  LMP:           30w 6d        Date:  04/15/21                  EDD:   01/20/22  U/S Today:     35w 2d                                        EDD:   12/20/21  Best:          33w 2d     Det. ByMarcella Dubs         EDD:   01/03/22                                      (06/04/21) ---------------------------------------------------------------------- Anatomy  Cranium:               Appears normal         LVOT:                   Previously seen  Cavum:                 Appears normal         Aortic Arch:            Previously seen  Ventricles:            Appears normal         Ductal Arch:            Previously seen  Choroid Plexus:        Previously seen        Diaphragm:              Previously seen  Cerebellum:            Previously seen        Stomach:                Appears normal, left  sided  Posterior Fossa:       Previously seen        Abdomen:                Appears normal  Nuchal Fold:           Not applicable (>20    Abdominal Wall:         Previously seen                         wks GA)  Face:                  Orbits and profile     Cord Vessels:           Previously seen                         previously seen  Lips:                  Appears normal          Kidneys:                Appear normal  Palate:                Appears normal         Bladder:                Appears normal  Thoracic:              Appears normal         Spine:                  Not well visualized  Heart:                 Abnormal, see          Upper Extremities:      Appears normal                         comments  RVOT:                  Previously seen        Lower Extremities:      Appears normal  Other:  Fetus appears to be a female. Heels, 5th digit, Nasal bone, VC, 3VV          and 3VTV previously visualized. Technically difficult due to fetal          position. ---------------------------------------------------------------------- Cervix Uterus Adnexa  Adnexa  No abnormality visualized. ---------------------------------------------------------------------- Comments  Cheryl Davila was seen for a follow up exam due to maternal  lupus. She denies any problems since her last exam.  She was informed that the fetal growth and amniotic fluid  level appears appropriate for her gestational age.  A BPP performed today was 8 out of 8.  On today's exam, a possible foramen ovale aneurysm was  noted.  The foramen ovale is noted to be bulging into the left  atrium.  This was not noted on her prior ultrasound exams.  The patient had a normal fetal echocardiogram earlier in her  pregnancy.  Due to the finding of a possible foramen ovale aneurysm, she  was referred back to pediatric cardiology for evaluation.  We  will await the recommendations from pediatric cardiology  regarding management of this finding.  She will return in 1 week  for another BPP.  A total of 20 minutes was spent counseling and coordinating  the care for this patient.  Greater than 50% of the time was  spent in direct face-to-face contact. ----------------------------------------------------------------------                   Cheryl Rings, MD Electronically Signed Final Report   11/18/2021 10:09 am  ----------------------------------------------------------------------  [place] MFM FETAL BPP WO NON STRESS  Result Date: 11/18/2021 ----------------------------------------------------------------------  OBSTETRICS REPORT                       (Signed Final 11/18/2021 10:09 am) ---------------------------------------------------------------------- Patient Info  ID #:       [id number]                          D.O.B.:  [birth year] (26 yrs)  Name:       Cheryl Davila                    Visit Date: 11/17/2021 09:08 am ---------------------------------------------------------------------- Performed By  Attending:        Ma Rings MD         Ref. Address:     [address]                                                             [place], [place]                                                             [address]  Performed By:     Cyndie Mull          [facility]  Referred By:      [facility] ---------------------------------------------------------------------- Orders  #  Description  Code        Ordered By  1  [place] MFM OB FOLLOW UP                   [id number]    Cheryl Davila  2  [place] MFM FETAL BPP WO NON               [id number]    Cheryl [facility]     STRESS ----------------------------------------------------------------------  #  Order #                     Accession #                Episode #  1  [id number]                   [id number]                 [id number]  2  [id number]                   [id number]                 [id number] ---------------------------------------------------------------------- Indications  Systemic lupus complicating pregnancy,         O26.893, M32.9  third trimester  History of cesarean delivery, currently         O34.219  pregnant  LR NIPS, 11.8%  [redacted] weeks gestation of pregnancy                Z3A.33  Horizon Neg ---------------------------------------------------------------------- Vital Signs  BP:          122/64 ---------------------------------------------------------------------- Fetal Evaluation  Num Of Fetuses:         1  Fetal Heart Rate(bpm):  145  Cardiac Activity:       Observed  Presentation:           Cephalic  Placenta:               Posterior Fundal  P. Cord Insertion:      Previously Visualized  Amniotic Fluid  AFI FV:      Within normal limits  AFI Sum(cm)     %Tile       Largest Pocket(cm)  11.5            29          4.2  RUQ(cm)       RLQ(cm)       LUQ(cm)        LLQ(cm)  3             4.2           2.5            1.8 ---------------------------------------------------------------------- Biophysical Evaluation  Amniotic F.V:   Pocket => 2 cm             F. Tone:        Observed  F. Movement:    Observed                   Score:          8/8  F. Breathing:   Observed ---------------------------------------------------------------------- Biometry  BPD:      90.4  mm     G. Age:  36w 4d       > 99  %    CI:        78.17   %    70 - 86  FL/HC:      20.3   %    19.9 - 21.5  HC:      323.5  mm     G. Age:  36w 4d         90  %    HC/AC:      1.08        0.96 - 1.11  AC:      300.3  mm     G. Age:  34w 0d         72  %    FL/BPD:     72.6   %    71 - 87  FL:       65.6  mm     G. Age:  33w 6d         53  %    FL/AC:      21.8   %    20 - 24  Est. FW:    2435  gm      5 lb 6 oz     77  % ---------------------------------------------------------------------- OB History  Gravidity:    2         Term:   1        Prem:   0        SAB:   0  TOP:          0       Ectopic:  0        Living: 1 ---------------------------------------------------------------------- Gestational Age  LMP:           30w 6d        Date:  04/15/21                  EDD:   01/20/22  U/S Today:      35w 2d                                        EDD:   12/20/21  Best:          33w 2d     Det. By:  Loman Chroman         EDD:   01/03/22                                      (06/04/21) ---------------------------------------------------------------------- Anatomy  Cranium:               Appears normal         LVOT:                   Previously seen  Cavum:                 Appears normal         Aortic Arch:            Previously seen  Ventricles:            Appears normal         Ductal Arch:            Previously seen  Choroid Plexus:        Previously seen        Diaphragm:              Previously seen  Cerebellum:            Previously seen        Stomach:                Appears normal, left                                                                        sided  Posterior Fossa:       Previously seen        Abdomen:                Appears normal  Nuchal Fold:           Not applicable (>20    Abdominal Wall:         Previously seen                         wks GA)  Face:                  Orbits and profile     Cord Vessels:           Previously seen                         previously seen  Lips:                  Appears normal         Kidneys:                Appear normal  Palate:                Appears normal         Bladder:                Appears normal  Thoracic:              Appears normal         Spine:                  Not well visualized  Heart:                 Abnormal, see          Upper Extremities:      Appears normal                         comments  RVOT:                  Previously seen        Lower Extremities:      Appears normal  Other:  Fetus appears to be a female. Heels, 5th digit, Nasal bone, VC, 3VV          and 3VTV previously visualized. Technically difficult due to fetal          position. ---------------------------------------------------------------------- Cervix Uterus Adnexa  Adnexa  No abnormality visualized.  ---------------------------------------------------------------------- Comments  Trinette Savarino was seen for a follow up exam due to maternal  lupus. She denies any problems since her last exam.  She was informed that the fetal growth and amniotic fluid  level appears appropriate for her gestational age.  A BPP performed today was 8 out of 8.  On today's exam, a possible foramen ovale aneurysm was  noted.  The foramen ovale is noted to be bulging into the left  atrium.  This was not noted on her prior ultrasound exams.  The patient had a normal fetal echocardiogram earlier in her  pregnancy.  Due to the finding of a possible foramen ovale aneurysm, she  was referred back to pediatric cardiology for evaluation.  We  will await the recommendations from pediatric cardiology  regarding management of this finding.  She will return in 1 week for another BPP.  A total of 20 minutes was spent counseling and coordinating  the care for this patient.  Greater than 50% of the time was  spent in direct face-to-face contact. ----------------------------------------------------------------------                   Cheryl Rings, MD Electronically Signed Final Report   11/18/2021 10:09 am ----------------------------------------------------------------------   Recent Labs: Lab Results  Component Value Date   WBC 9.4 10/20/2021   HGB 10.3 (L) 10/20/2021   PLT 249 10/20/2021   NA 133 (L) 08/18/2021   K 3.7 08/18/2021   CL 103 08/18/2021   CO2 21 08/18/2021   GLUCOSE 84 08/18/2021   BUN 7 08/18/2021   CREATININE 0.55 08/18/2021   BILITOT 0.3 08/18/2021   AST 13 08/18/2021   ALT 11 08/18/2021   PROT 7.4 08/18/2021   CALCIUM 8.8 08/18/2021    Speciality Comments: PLQ Eye Exam: 09/27/2021 WNL @ [facility] Follow up in 1 year  Procedures:  No procedures performed Allergies: Patient has no known allergies.   Assessment / Plan:     Visit Diagnoses: :Other systemic lupus erythematosus with other organ  involvement (HCC) - Positive ANA, positive SSA, positive lupus anticoagulant, history of fatigue, joint pain, chest tightness, fetal congenital heart block 2021:  She has not had any signs or symptoms of a systemic lupus flare.  Patient will be [redacted] weeks pregnant tomorrow.  She has clinically been doing well taking Plaquenil 200 mg 1 tablet by mouth twice daily and aspirin 81 mg daily.  She is tolerating Plaquenil without any side effects and has not missed any doses recently.  Her blood pressure has been well controlled and is currently 110/72 today in the office.  She has not had any recent rashes, Raynaud's phenomenon, hair loss, or cervical lymphadenopathy.  She has occasional mouth sores as well as arthralgias.  She had no synovitis on examination today. Reviewed Dr. Loretta Plume note from 11/17/21: Fetal growth and amniotic fluid were appropriate for gestational age at that time.  There was a possible foramen ovale aneurysm noted, which was new on U/s.  Planning to continue weekly follow up visits.  Reviewed visits on 11/21/21 and 12/07/21. Next fetal BPP scheduled on 12/15/21.  Lab work from 08/18/2021 was reviewed today in the office: Double-stranded and negative, complements within normal limits, lupus anticoagulant not detected, and sed rate 48.  The following lab work will be obtained today to assess for active disease.   Discussed the importance of remaining on plaquenil as prescribed. Discussed the risk for a flare during the postpartum period.  She was advised to notify us if she develops signs or symptoms of a flare.  She will follow-up in the office in 2 to 3 months or sooner if needed. Delivery planned by 39 weeks-Patient desires VBAC.   -  Plan: Anti-DNA antibody, double-stranded, C3 and C4, Sedimentation rate, Protein / creatinine ratio, urine, CBC with Differential/Platelet, COMPLETE METABOLIC PANEL WITH GFR  Lupus anticoagulant positive - History of positive lupus anticoagulant.  Lupus  anticoagulant was not detected on 08/18/2021.  She is currently taking aspirin 81 mg daily.   High risk medication use - Plaquenil 200 mg 1 tablet by mouth twice daily- started in early April 2023. PLQ Eye Exam: 09/27/2021 WNL @ [facility] Follow up in 1 year.  CBC and CMP updated today.   - Plan: CBC with Differential/Platelet, COMPLETE METABOLIC PANEL WITH GFR  Pain in both hands: She has no joint tenderness or synovitis.   Other fatigue: Stable.   Heart block, fetal, affecting care of mother - Her daughter has a pacemaker.  She established care with maternal-fetal medicine on 09/01/2021.  Orders: Orders Placed This Encounter  Procedures  . Anti-DNA antibody, double-stranded  . C3 and C4  . Sedimentation rate  . Protein / creatinine ratio, urine  . CBC with Differential/Platelet  . COMPLETE METABOLIC PANEL WITH GFR   No orders of the defined types were placed in this encounter.   Follow-Up Instructions: Return in about 3 months (around 03/14/2022) for Systemic lupus erythematosus.   Gearldine Bienenstock, PA-C  Note - This record has been created using Dragon software.  Chart creation errors have been sought, but may not always  have been located. Such creation errors do not reflect on  the standard of medical care.

## 2021-12-01 ENCOUNTER — Encounter: Payer: Self-pay | Admitting: *Deleted

## 2021-12-01 ENCOUNTER — Ambulatory Visit: Payer: Medicaid Other | Attending: Maternal & Fetal Medicine

## 2021-12-01 ENCOUNTER — Ambulatory Visit: Payer: Medicaid Other | Admitting: *Deleted

## 2021-12-01 ENCOUNTER — Other Ambulatory Visit: Payer: Self-pay | Admitting: *Deleted

## 2021-12-01 ENCOUNTER — Other Ambulatory Visit: Payer: Self-pay | Admitting: Maternal & Fetal Medicine

## 2021-12-01 DIAGNOSIS — Z3A35 35 weeks gestation of pregnancy: Secondary | ICD-10-CM

## 2021-12-01 DIAGNOSIS — M329 Systemic lupus erythematosus, unspecified: Secondary | ICD-10-CM

## 2021-12-01 DIAGNOSIS — O34219 Maternal care for unspecified type scar from previous cesarean delivery: Secondary | ICD-10-CM | POA: Diagnosis not present

## 2021-12-01 DIAGNOSIS — O99891 Other specified diseases and conditions complicating pregnancy: Secondary | ICD-10-CM | POA: Diagnosis not present

## 2021-12-06 ENCOUNTER — Other Ambulatory Visit: Payer: Self-pay | Admitting: *Deleted

## 2021-12-06 DIAGNOSIS — D6862 Lupus anticoagulant syndrome: Secondary | ICD-10-CM

## 2021-12-06 DIAGNOSIS — O283 Abnormal ultrasonic finding on antenatal screening of mother: Secondary | ICD-10-CM

## 2021-12-07 ENCOUNTER — Other Ambulatory Visit (HOSPITAL_COMMUNITY)
Admission: RE | Admit: 2021-12-07 | Discharge: 2021-12-07 | Disposition: A | Discharge status: Home / Self Care | Payer: Medicaid Other | Source: Ambulatory Visit | Attending: Obstetrics and Gynecology | Admitting: Obstetrics and Gynecology

## 2021-12-07 ENCOUNTER — Other Ambulatory Visit: Payer: Self-pay

## 2021-12-07 ENCOUNTER — Ambulatory Visit (INDEPENDENT_AMBULATORY_CARE_PROVIDER_SITE_OTHER): Payer: Medicaid Other | Admitting: Obstetrics and Gynecology

## 2021-12-07 VITALS — BP 110/78 | HR 114 | Wt 179.6 lb

## 2021-12-07 DIAGNOSIS — Z3A36 36 weeks gestation of pregnancy: Secondary | ICD-10-CM | POA: Insufficient documentation

## 2021-12-07 DIAGNOSIS — O361990 Maternal care for other isoimmunization, unspecified trimester, not applicable or unspecified: Secondary | ICD-10-CM

## 2021-12-07 DIAGNOSIS — M329 Systemic lupus erythematosus, unspecified: Secondary | ICD-10-CM

## 2021-12-07 DIAGNOSIS — O0990 Supervision of high risk pregnancy, unspecified, unspecified trimester: Secondary | ICD-10-CM

## 2021-12-07 DIAGNOSIS — O0993 Supervision of high risk pregnancy, unspecified, third trimester: Secondary | ICD-10-CM | POA: Diagnosis present

## 2021-12-07 DIAGNOSIS — Z98891 History of uterine scar from previous surgery: Secondary | ICD-10-CM

## 2021-12-07 NOTE — Addendum Note (Signed)
Addended by: Madalyn Rob D on: 12/07/2021 12:07 PM   Modules accepted: Orders

## 2021-12-07 NOTE — Progress Notes (Signed)
   PRENATAL VISIT NOTE  Subjective:  Cheryl Davila is a 26 y.o. G2P1001 at [redacted]w[redacted]d being seen today for ongoing prenatal care.  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  Patient doing well with no acute concerns today. She reports no complaints.  Contractions: Not present. Vag. Bleeding: None.  Movement: Present. Denies leaking of fluid.   The following portions of the patient's history were reviewed and updated as appropriate: allergies, current medications, past family history, past medical history, past social history, past surgical history and problem list. Problem list updated.  Objective:   Vitals:   12/07/21 1113  BP: 110/78  Pulse: (!) 114  Weight: 179 lb 9.6 oz (81.5 kg)    Fetal Status: Fetal Heart Rate (bpm): 150 Fundal Height: 35 cm Movement: Present     General:  Alert, oriented and cooperative. Patient is in no acute distress.  Skin: Skin is warm and dry. No rash noted.   Cardiovascular: Normal heart rate noted  Respiratory: Normal respiratory effort, no problems with respiration noted  Abdomen: Soft, gravid, appropriate for gestational age.  Pain/Pressure: Absent     Pelvic: Cervical exam deferred        Extremities: Normal range of motion.  Edema: None  Mental Status:  Normal mood and affect. Normal behavior. Normal judgment and thought content.   Assessment and Plan:  Pregnancy: G2P1001 at [redacted]w[redacted]d  1. [redacted] weeks gestation of pregnancy   2. SLE (systemic lupus erythematosus related syndrome) ([place]) Pt continues with plaquenil, per MFM recs pt to be delivered by 39 weeks Discuss IOL date at next visit  3. Supervision of high risk pregnancy, antepartum Continue routine prenatal  care  4. H/O cesarean section Pt desires VBAC  5. Maternal atypical antibody affecting pregnancy, antepartum, single or unspecified fetus   Preterm labor symptoms and general obstetric precautions including but not limited to vaginal bleeding, contractions, leaking of fluid and fetal movement were reviewed in detail with the patient.  Please refer to After Visit Summary for other counseling recommendations.   Return in about 1 week (around 12/14/2021) for [facility], in person.   Lynnda Shields, MD Faculty Attending Center for [facility]

## 2021-12-08 ENCOUNTER — Other Ambulatory Visit: Payer: Medicaid Other

## 2021-12-08 ENCOUNTER — Ambulatory Visit: Payer: Medicaid Other | Admitting: *Deleted

## 2021-12-08 ENCOUNTER — Ambulatory Visit: Payer: Medicaid Other | Attending: Obstetrics and Gynecology

## 2021-12-08 ENCOUNTER — Ambulatory Visit: Payer: Medicaid Other

## 2021-12-08 VITALS — BP 105/68 | HR 80

## 2021-12-08 DIAGNOSIS — O99891 Other specified diseases and conditions complicating pregnancy: Secondary | ICD-10-CM | POA: Diagnosis not present

## 2021-12-08 DIAGNOSIS — O283 Abnormal ultrasonic finding on antenatal screening of mother: Secondary | ICD-10-CM | POA: Diagnosis present

## 2021-12-08 DIAGNOSIS — M329 Systemic lupus erythematosus, unspecified: Secondary | ICD-10-CM

## 2021-12-08 DIAGNOSIS — O99113 Other diseases of the blood and blood-forming organs and certain disorders involving the immune mechanism complicating pregnancy, third trimester: Secondary | ICD-10-CM | POA: Insufficient documentation

## 2021-12-08 DIAGNOSIS — O34219 Maternal care for unspecified type scar from previous cesarean delivery: Secondary | ICD-10-CM

## 2021-12-08 DIAGNOSIS — D6862 Lupus anticoagulant syndrome: Secondary | ICD-10-CM | POA: Diagnosis present

## 2021-12-08 DIAGNOSIS — O35BXX0 Maternal care for other (suspected) fetal abnormality and damage, fetal cardiac anomalies, not applicable or unspecified: Secondary | ICD-10-CM

## 2021-12-08 DIAGNOSIS — Z3A36 36 weeks gestation of pregnancy: Secondary | ICD-10-CM

## 2021-12-08 LAB — CERVICOVAGINAL ANCILLARY ONLY
Chlamydia: NEGATIVE
Comment: NEGATIVE
Comment: NEGATIVE
Comment: NORMAL
Neisseria Gonorrhea: NEGATIVE
Trichomonas: NEGATIVE

## 2021-12-11 LAB — CULTURE, BETA STREP (GROUP B ONLY): Strep Gp B Culture: NEGATIVE

## 2021-12-12 ENCOUNTER — Encounter: Payer: Self-pay | Admitting: Physician Assistant

## 2021-12-12 ENCOUNTER — Ambulatory Visit: Payer: Medicaid Other | Attending: Physician Assistant | Admitting: Physician Assistant

## 2021-12-12 VITALS — BP 110/72 | HR 96 | Resp 14 | Ht 67.0 in | Wt 183.6 lb

## 2021-12-12 DIAGNOSIS — R760 Raised antibody titer: Secondary | ICD-10-CM | POA: Diagnosis not present

## 2021-12-12 DIAGNOSIS — M79641 Pain in right hand: Secondary | ICD-10-CM | POA: Diagnosis not present

## 2021-12-12 DIAGNOSIS — Z79899 Other long term (current) drug therapy: Secondary | ICD-10-CM | POA: Diagnosis not present

## 2021-12-12 DIAGNOSIS — R5383 Other fatigue: Secondary | ICD-10-CM

## 2021-12-12 DIAGNOSIS — M3219 Other organ or system involvement in systemic lupus erythematosus: Secondary | ICD-10-CM

## 2021-12-12 DIAGNOSIS — M79642 Pain in left hand: Secondary | ICD-10-CM

## 2021-12-12 DIAGNOSIS — O35BXX0 Maternal care for other (suspected) fetal abnormality and damage, fetal cardiac anomalies, not applicable or unspecified: Secondary | ICD-10-CM

## 2021-12-13 ENCOUNTER — Encounter: Payer: Self-pay | Admitting: Obstetrics and Gynecology

## 2021-12-13 LAB — CBC WITH DIFFERENTIAL/PLATELET
Absolute Monocytes: 878 cells/uL (ref 200–950)
Basophils Absolute: 70 cells/uL (ref 0–200)
Basophils Relative: 0.6 %
Eosinophils Absolute: 94 cells/uL (ref 15–500)
Eosinophils Relative: 0.8 %
HCT: 32.3 % — ABNORMAL LOW (ref 35.0–45.0)
Hemoglobin: 10 g/dL — ABNORMAL LOW (ref 11.7–15.5)
Lymphs Abs: 2317 cells/uL (ref 850–3900)
MCH: 22.9 pg — ABNORMAL LOW (ref 27.0–33.0)
MCHC: 31 g/dL — ABNORMAL LOW (ref 32.0–36.0)
MCV: 74.1 fL — ABNORMAL LOW (ref 80.0–100.0)
MPV: 11.7 fL (ref 7.5–12.5)
Monocytes Relative: 7.5 %
Neutro Abs: 8342 cells/uL — ABNORMAL HIGH (ref 1500–7800)
Neutrophils Relative %: 71.3 %
Platelets: 282 10*3/uL (ref 140–400)
RBC: 4.36 10*6/uL (ref 3.80–5.10)
RDW: 16 % — ABNORMAL HIGH (ref 11.0–15.0)
Total Lymphocyte: 19.8 %
WBC: 11.7 10*3/uL — ABNORMAL HIGH (ref 3.8–10.8)

## 2021-12-13 LAB — COMPLETE METABOLIC PANEL WITH GFR
AG Ratio: 0.8 (calc) — ABNORMAL LOW (ref 1.0–2.5)
ALT: 9 U/L (ref 6–29)
AST: 14 U/L (ref 10–30)
Albumin: 3.2 g/dL — ABNORMAL LOW (ref 3.6–5.1)
Alkaline phosphatase (APISO): 136 U/L — ABNORMAL HIGH (ref 31–125)
BUN/Creatinine Ratio: 8 (calc) (ref 6–22)
BUN: 4 mg/dL — ABNORMAL LOW (ref 7–25)
CO2: 23 mmol/L (ref 20–32)
Calcium: 8.7 mg/dL (ref 8.6–10.2)
Chloride: 103 mmol/L (ref 98–110)
Creat: 0.51 mg/dL (ref 0.50–0.96)
Globulin: 3.9 g/dL (calc) — ABNORMAL HIGH (ref 1.9–3.7)
Glucose, Bld: 79 mg/dL (ref 65–99)
Potassium: 4.2 mmol/L (ref 3.5–5.3)
Sodium: 135 mmol/L (ref 135–146)
Total Bilirubin: 0.3 mg/dL (ref 0.2–1.2)
Total Protein: 7.1 g/dL (ref 6.1–8.1)
eGFR: 133 mL/min/{1.73_m2} (ref >=60)

## 2021-12-13 LAB — PROTEIN / CREATININE RATIO, URINE
Creatinine, Urine: 99 mg/dL (ref 20–275)
Protein/Creat Ratio: 141 mg/g creat (ref 24–184)
Protein/Creatinine Ratio: 0.141 mg/mg creat (ref 0.024–0.184)
Total Protein, Urine: 14 mg/dL (ref 5–24)

## 2021-12-13 LAB — ANTI-DNA ANTIBODY, DOUBLE-STRANDED: ds DNA Ab: 2 IU/mL

## 2021-12-13 LAB — C3 AND C4
C3 Complement: 199 mg/dL — ABNORMAL HIGH (ref 83–193)
C4 Complement: 19 mg/dL (ref 15–57)

## 2021-12-13 LAB — SEDIMENTATION RATE: Sed Rate: 92 mm/h — ABNORMAL HIGH (ref 0–20)

## 2021-12-14 NOTE — Progress Notes (Signed)
CBC and CMP stable.  ESR is elevated-patient is currently pregnant.  C4 WNL. C3 is elevated-199-not a sign of a flare at this time. dsDNA is negative.  Protein creatinine ratio WNL. Labs are not consistent with a flare.

## 2021-12-15 ENCOUNTER — Other Ambulatory Visit: Payer: Medicaid Other

## 2021-12-15 ENCOUNTER — Ambulatory Visit: Payer: Medicaid Other

## 2021-12-16 ENCOUNTER — Ambulatory Visit: Payer: Medicaid Other | Admitting: *Deleted

## 2021-12-16 ENCOUNTER — Ambulatory Visit: Payer: Medicaid Other | Attending: Maternal & Fetal Medicine

## 2021-12-16 VITALS — BP 129/66 | HR 89

## 2021-12-16 DIAGNOSIS — M329 Systemic lupus erythematosus, unspecified: Secondary | ICD-10-CM | POA: Diagnosis not present

## 2021-12-16 DIAGNOSIS — D6862 Lupus anticoagulant syndrome: Secondary | ICD-10-CM | POA: Diagnosis not present

## 2021-12-16 DIAGNOSIS — O99119 Other diseases of the blood and blood-forming organs and certain disorders involving the immune mechanism complicating pregnancy, unspecified trimester: Secondary | ICD-10-CM

## 2021-12-16 DIAGNOSIS — O26893 Other specified pregnancy related conditions, third trimester: Secondary | ICD-10-CM | POA: Diagnosis not present

## 2021-12-16 DIAGNOSIS — O99113 Other diseases of the blood and blood-forming organs and certain disorders involving the immune mechanism complicating pregnancy, third trimester: Secondary | ICD-10-CM | POA: Insufficient documentation

## 2021-12-16 DIAGNOSIS — O283 Abnormal ultrasonic finding on antenatal screening of mother: Secondary | ICD-10-CM

## 2021-12-16 DIAGNOSIS — Z3A37 37 weeks gestation of pregnancy: Secondary | ICD-10-CM

## 2021-12-16 DIAGNOSIS — O34219 Maternal care for unspecified type scar from previous cesarean delivery: Secondary | ICD-10-CM

## 2021-12-19 ENCOUNTER — Telehealth (INDEPENDENT_AMBULATORY_CARE_PROVIDER_SITE_OTHER): Payer: Medicaid Other | Admitting: Obstetrics and Gynecology

## 2021-12-19 VITALS — BP 109/75 | Wt 183.8 lb

## 2021-12-19 DIAGNOSIS — O361930 Maternal care for other isoimmunization, third trimester, not applicable or unspecified: Secondary | ICD-10-CM

## 2021-12-19 DIAGNOSIS — R768 Other specified abnormal immunological findings in serum: Secondary | ICD-10-CM

## 2021-12-19 DIAGNOSIS — O0990 Supervision of high risk pregnancy, unspecified, unspecified trimester: Secondary | ICD-10-CM

## 2021-12-19 DIAGNOSIS — O34219 Maternal care for unspecified type scar from previous cesarean delivery: Secondary | ICD-10-CM

## 2021-12-19 DIAGNOSIS — R051 Acute cough: Secondary | ICD-10-CM

## 2021-12-19 DIAGNOSIS — Z3A37 37 weeks gestation of pregnancy: Secondary | ICD-10-CM

## 2021-12-19 DIAGNOSIS — O26893 Other specified pregnancy related conditions, third trimester: Secondary | ICD-10-CM

## 2021-12-19 DIAGNOSIS — Z98891 History of uterine scar from previous surgery: Secondary | ICD-10-CM

## 2021-12-19 DIAGNOSIS — Z8249 Family history of ischemic heart disease and other diseases of the circulatory system: Secondary | ICD-10-CM

## 2021-12-19 DIAGNOSIS — M329 Systemic lupus erythematosus, unspecified: Secondary | ICD-10-CM

## 2021-12-19 DIAGNOSIS — O09293 Supervision of pregnancy with other poor reproductive or obstetric history, third trimester: Secondary | ICD-10-CM

## 2021-12-19 NOTE — Progress Notes (Signed)
TELEHEALTH OBSTETRICS VISIT ENCOUNTER NOTE  Provider location: Center for [place] [facility]   Patient location: Home  I connected with [name] on 12/19/21 at 10:55 AM EDT by telephone at home and verified that I am speaking with the correct person using two identifiers. Of note, unable to do video encounter due to technical difficulties.    I discussed the limitations, risks, security and privacy concerns of performing an evaluation and management service by telephone and the availability of in person appointments. I also discussed with the patient that there may be a patient responsible charge related to this service. The patient expressed understanding and agreed to proceed.  Subjective:  Cheryl Davila is a 26 y.o. G2P1001 at [redacted]w[redacted]d being followed for ongoing prenatal care.  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  Patient reports no complaints. Reports fetal movement. Denies any contractions, bleeding or leaking of fluid.   The following portions of the patient's history were reviewed and updated as appropriate: allergies, current medications, past family history, past medical history, past social history, past surgical history and problem list.   Objective:  Blood pressure 109/75, weight 183 lb 12.8 oz (83.4 kg), last menstrual period 04/15/2021, unknown if currently breastfeeding. General:  Alert, oriented and cooperative.   Mental Status: Normal mood and affect perceived. Normal judgment and thought content.  Rest of physical exam deferred due to type of encounter  Assessment and Plan:  Pregnancy: G2P1001 at [redacted]w[redacted]d 1. Acute cough Saw PCP on Friday and  thought maybe allergies and mucinex recommended, no swabs obtained, lung exam negative. Patient with just dry cough (?maybe sometimes bringing up something) but no chest pain, sob, fevers, chills. S/s started Oct 14th and her brother had PNA which is what prompted PCP evaluation; pt states pcp aware brother had PNA.  I told her that likely just viral but to touch base with PCP that they may recommend abx, like a z-pack, given s/s going on 2wks now.   ED precautions given.  2. [redacted] weeks gestation of pregnancy GBS neg  3. 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 F/u hospital t&s  4. SLE (systemic lupus erythematosus related syndrome) (HCC) Stable on plaquenil. Saw rheum on 10/23 and labs wnl and no c/w flares I d/w her rationale for 39-40/0 IOL. Pt nervous about IOL. I told her about slightly higher than 1% risk of rupture with IOL vs spontaneous labor but risk is minimal and that the good thing is that she had a c/s for non labor reasons so her baseline success rate is approx 85% for a VBAC. Will see patient back when she is here for antenatal testing with MFM and talk more and finalize delivery planning  10/27: ceph, afi 15.7, 89%, 3597g, ac 96%  5. H/O cesarean section 2021 PLTCS for fetal heart block, never labored. TOLAC consent signed (see above).   6. Biological false positive RPR test Neg w/ 28wk labs  Term labor symptoms and general obstetric precautions including but not limited to vaginal bleeding, contractions, leaking of fluid and fetal movement were reviewed in detail with the patient.  I discussed the assessment and treatment plan with the patient.  The patient was provided an opportunity to ask questions and all were answered. The patient agreed with the plan and demonstrated an understanding of the instructions. The patient was advised to call back or seek an in-person office evaluation/go to MAU  at [facility] for any urgent or concerning symptoms. Please refer to After Visit Summary for other counseling recommendations.   I provided 10 minutes of non-face-to-face time during this encounter.  Return in about 3 days (around 12/22/2021) for in person, high risk ob, md visit.  Future Appointments  Date Time Provider Department Center  12/22/2021 10:15 AM [facility] NURSE [facility]  12/22/2021 10:30 AM [facility] US2 [facility]  12/29/2021 10:15 AM [facility] NURSE [facility]  12/29/2021 10:30 AM [facility] US2 [facility]  03/16/2022  9:00 AM Gearldine Bienenstock, PA-C [facility] None    [place] Bing, MD Center for [facility], [facility]

## 2021-12-22 ENCOUNTER — Other Ambulatory Visit: Payer: Medicaid Other

## 2021-12-22 ENCOUNTER — Ambulatory Visit: Payer: Medicaid Other | Admitting: *Deleted

## 2021-12-22 ENCOUNTER — Ambulatory Visit: Payer: Medicaid Other

## 2021-12-22 ENCOUNTER — Ambulatory Visit: Payer: Medicaid Other | Attending: Obstetrics and Gynecology

## 2021-12-22 VITALS — BP 138/70 | HR 83

## 2021-12-22 DIAGNOSIS — O99891 Other specified diseases and conditions complicating pregnancy: Secondary | ICD-10-CM

## 2021-12-22 DIAGNOSIS — Z3A38 38 weeks gestation of pregnancy: Secondary | ICD-10-CM

## 2021-12-22 DIAGNOSIS — M329 Systemic lupus erythematosus, unspecified: Secondary | ICD-10-CM | POA: Diagnosis not present

## 2021-12-22 DIAGNOSIS — O358XX0 Maternal care for other (suspected) fetal abnormality and damage, not applicable or unspecified: Secondary | ICD-10-CM

## 2021-12-22 DIAGNOSIS — O283 Abnormal ultrasonic finding on antenatal screening of mother: Secondary | ICD-10-CM | POA: Insufficient documentation

## 2021-12-22 DIAGNOSIS — O99113 Other diseases of the blood and blood-forming organs and certain disorders involving the immune mechanism complicating pregnancy, third trimester: Secondary | ICD-10-CM | POA: Insufficient documentation

## 2021-12-22 DIAGNOSIS — D6862 Lupus anticoagulant syndrome: Secondary | ICD-10-CM | POA: Insufficient documentation

## 2021-12-22 DIAGNOSIS — O34219 Maternal care for unspecified type scar from previous cesarean delivery: Secondary | ICD-10-CM

## 2021-12-28 ENCOUNTER — Ambulatory Visit (INDEPENDENT_AMBULATORY_CARE_PROVIDER_SITE_OTHER): Payer: Medicaid Other | Admitting: Family Medicine

## 2021-12-28 ENCOUNTER — Other Ambulatory Visit: Payer: Self-pay

## 2021-12-28 VITALS — BP 116/76 | HR 102 | Wt 182.6 lb

## 2021-12-28 DIAGNOSIS — Z8249 Family history of ischemic heart disease and other diseases of the circulatory system: Secondary | ICD-10-CM

## 2021-12-28 DIAGNOSIS — M329 Systemic lupus erythematosus, unspecified: Secondary | ICD-10-CM

## 2021-12-28 DIAGNOSIS — R768 Other specified abnormal immunological findings in serum: Secondary | ICD-10-CM

## 2021-12-28 DIAGNOSIS — O361930 Maternal care for other isoimmunization, third trimester, not applicable or unspecified: Secondary | ICD-10-CM

## 2021-12-28 DIAGNOSIS — Z98891 History of uterine scar from previous surgery: Secondary | ICD-10-CM

## 2021-12-28 DIAGNOSIS — Z3A39 39 weeks gestation of pregnancy: Secondary | ICD-10-CM

## 2021-12-28 DIAGNOSIS — O0990 Supervision of high risk pregnancy, unspecified, unspecified trimester: Secondary | ICD-10-CM

## 2021-12-28 DIAGNOSIS — O0993 Supervision of high risk pregnancy, unspecified, third trimester: Secondary | ICD-10-CM

## 2021-12-28 NOTE — Progress Notes (Unsigned)
PRENATAL VISIT NOTE  Subjective:  Cheryl Davila is a 26 y.o. G2P1001 at [redacted]w[redacted]d being seen today for ongoing prenatal care.  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  Patient reports no complaints.  Contractions: Irritability. Vag. Bleeding: None.  Movement: Present. Denies leaking of fluid.   The following portions of the patient's history were reviewed and updated as appropriate: allergies, current medications, past family history, past medical history, past social history, past surgical history and problem list.   Objective:   Vitals:   12/28/21 1330  BP: 116/76  Pulse: (!) 102  Weight: 182 lb 9.6 oz (82.8 kg)    Fetal Status: Fetal Heart Rate (bpm): 140 Fundal Height: 39 cm Movement: Present     General:  Alert, oriented and cooperative. Patient is in no acute distress.  Skin: Skin is warm and dry. No rash noted.   Cardiovascular: Normal heart rate noted  Respiratory: Normal respiratory effort, no problems with respiration noted  Abdomen: Soft, gravid, appropriate for gestational age.  Pain/Pressure: Absent     Pelvic: Cervical exam performed in the presence of a chaperone Dilation: Closed Effacement (%): 70 Station: -3  Extremities: Normal range of motion.     Mental Status: Normal mood and affect. Normal behavior. Normal judgment and thought content.   Assessment and Plan:  Pregnancy: G2P1001 at [redacted]w[redacted]d 1. Biological false positive RPR test LD aware  2. Family history of heart block Last pregnancy, no signs of this in current pregnancy  3. H/O cesarean section Strongly desires TOLAC  4. 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 SLE (systemic lupus erythematosus related syndrome) (HCC) Discussed recommendation for IOL 39-40wks Patient is strongly against induction and wants to go into spontaneous labor We discussed the "why" behind IOL and the risk that her lupus, even well controlled has effected her placenta and the increased risk for stillbirth associated with high risk conditions like lupus.  Reviewed in detail IOL process and patient is against foley bulb and pitocin. We discussed typical cervical ripening with bulb, cytotec and use of pitocin for contractions if not happening. Discussed the role of AROM in induction Discussed that she is not having early signs of labor yet and she is closed on exam today  Patient agreed to IOL right after her EDD  6. Supervision of high risk pregnancy, antepartum Agrees to IOL 11/17 or 11/18 (this is her choice and she accepted and voiced understanding about risks) Up to date Discussed membrane sweeping, patient was not eligible for this today but would be interested in the future.  Term labor symptoms and general obstetric precautions including but not limited to vaginal bleeding, contractions, leaking of fluid and fetal movement were reviewed in detail with the patient. Please refer to After Visit Summary for other counseling recommendations.   Return in about 1 week (around 01/04/2022) for Routine prenatal care.  Future Appointments  Date Time Provider Department Center  12/29/2021 10:15 AM [facility] NURSE [facility]  12/29/2021 10:30 AM [facility] US2 [facility]  01/04/2022  9:35 AM Venora Maples, MD [facility]  01/07/2022  6:45 AM MC-LD SCHED ROOM MC-INDC None  01/11/2022  9:35 AM Venora Maples, MD [facility]  01/11/2022 11:15 AM [facility] NST [facility]  03/16/2022  9:00 AM Gearldine Bienenstock, PA-C [facility] None    Federico Flake, MD

## 2021-12-29 ENCOUNTER — Ambulatory Visit: Payer: Medicaid Other | Attending: Obstetrics and Gynecology

## 2021-12-29 ENCOUNTER — Ambulatory Visit: Payer: Medicaid Other | Admitting: *Deleted

## 2021-12-29 VITALS — BP 123/71 | HR 68

## 2021-12-29 DIAGNOSIS — Z3A39 39 weeks gestation of pregnancy: Secondary | ICD-10-CM

## 2021-12-29 DIAGNOSIS — D6862 Lupus anticoagulant syndrome: Secondary | ICD-10-CM | POA: Insufficient documentation

## 2021-12-29 DIAGNOSIS — M329 Systemic lupus erythematosus, unspecified: Secondary | ICD-10-CM

## 2021-12-29 DIAGNOSIS — O34219 Maternal care for unspecified type scar from previous cesarean delivery: Secondary | ICD-10-CM | POA: Insufficient documentation

## 2021-12-29 DIAGNOSIS — O99113 Other diseases of the blood and blood-forming organs and certain disorders involving the immune mechanism complicating pregnancy, third trimester: Secondary | ICD-10-CM | POA: Insufficient documentation

## 2021-12-29 DIAGNOSIS — O358XX0 Maternal care for other (suspected) fetal abnormality and damage, not applicable or unspecified: Secondary | ICD-10-CM | POA: Diagnosis not present

## 2021-12-29 DIAGNOSIS — O283 Abnormal ultrasonic finding on antenatal screening of mother: Secondary | ICD-10-CM | POA: Diagnosis present

## 2021-12-29 DIAGNOSIS — O26893 Other specified pregnancy related conditions, third trimester: Secondary | ICD-10-CM

## 2021-12-30 ENCOUNTER — Telehealth (HOSPITAL_COMMUNITY): Payer: Self-pay | Admitting: *Deleted

## 2021-12-30 ENCOUNTER — Encounter: Payer: Self-pay | Admitting: Family Medicine

## 2021-12-30 ENCOUNTER — Encounter (HOSPITAL_COMMUNITY): Payer: Self-pay

## 2021-12-30 NOTE — Telephone Encounter (Signed)
Preadmission screen  

## 2022-01-02 ENCOUNTER — Telehealth (HOSPITAL_COMMUNITY): Payer: Self-pay | Admitting: *Deleted

## 2022-01-02 ENCOUNTER — Encounter (HOSPITAL_COMMUNITY): Payer: Self-pay | Admitting: *Deleted

## 2022-01-02 NOTE — Telephone Encounter (Signed)
Preadmission screen  

## 2022-01-03 ENCOUNTER — Other Ambulatory Visit: Payer: Self-pay

## 2022-01-03 ENCOUNTER — Inpatient Hospital Stay (HOSPITAL_COMMUNITY)
Admission: AD | Admit: 2022-01-03 | Discharge: 2022-01-03 | Disposition: A | Discharge status: Home / Self Care | Payer: Medicaid Other | Attending: Obstetrics and Gynecology | Admitting: Obstetrics and Gynecology

## 2022-01-03 ENCOUNTER — Other Ambulatory Visit: Payer: Self-pay | Admitting: Advanced Practice Midwife

## 2022-01-03 ENCOUNTER — Encounter (HOSPITAL_COMMUNITY): Payer: Self-pay | Admitting: Obstetrics and Gynecology

## 2022-01-03 DIAGNOSIS — O480 Post-term pregnancy: Secondary | ICD-10-CM | POA: Diagnosis not present

## 2022-01-03 DIAGNOSIS — O471 False labor at or after 37 completed weeks of gestation: Secondary | ICD-10-CM | POA: Diagnosis not present

## 2022-01-03 DIAGNOSIS — O368130 Decreased fetal movements, third trimester, not applicable or unspecified: Secondary | ICD-10-CM | POA: Insufficient documentation

## 2022-01-03 DIAGNOSIS — O99891 Other specified diseases and conditions complicating pregnancy: Secondary | ICD-10-CM

## 2022-01-03 DIAGNOSIS — Z3689 Encounter for other specified antenatal screening: Secondary | ICD-10-CM | POA: Diagnosis not present

## 2022-01-03 DIAGNOSIS — Z3A40 40 weeks gestation of pregnancy: Secondary | ICD-10-CM | POA: Diagnosis not present

## 2022-01-03 NOTE — MAU Note (Signed)
.  Cheryl Davila is a 26 y.o. at [redacted]w[redacted]d here in MAU reporting decreased FM all day. Last felt FM at lunch time. Denies LOF or VB. For IOL Sat.   Onset of complaint: today Pain score: 0 Vitals:   01/03/22 2134  Pulse: 86  SpO2: 100%     FHT:140 Lab orders placed from triage:  none

## 2022-01-03 NOTE — MAU Provider Note (Signed)
Chief Complaint:  Decreased Fetal Movement   Event Date/Time   First Provider Initiated Contact with Patient 01/03/22 2204     HPI: Cheryl Davila is a 26 y.o. G2P1001 at 71w0dwho presents to maternity admissions reporting decreased fetal movement all day.  Is scheduled for IOL this weekend for post dates, TOLAC and Lupus.  Having painless contractions but does not feel anything. . She reports good fetal movement, denies LOF, vaginal bleeding, vaginal itching/burning, urinary symptoms, h/a, dizziness, n/v, diarrhea, constipation or fever/chills.  She denies headache, visual changes or RUQ abdominal pain.  Other This is a new problem. The current episode started today. The problem has been unchanged. Pertinent negatives include no abdominal pain, chills, fever, headaches, myalgias or nausea. Nothing aggravates the symptoms. She has tried lying down, eating, drinking and position changes for the symptoms. The treatment provided no relief.   RN Note: Cheryl Davila is a 26 y.o. at [redacted]w[redacted]d here in MAU reporting decreased FM all day. Last felt FM at lunch time. Denies LOF or VB. For IOL Sat.   Onset of complaint: today Pain score: 0  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    Past obstetric history: OB History  Gravida Para Term Preterm AB Living  2 1 1     1   SAB IAB Ectopic Multiple Live Births          1    # Outcome Date GA Lbr Len/2nd Weight Sex Delivery Anes PTL Lv  2 Current           1 Term 10/10/19 [redacted]w[redacted]d  3033 g F CS-Unspec   LIV    Past Surgical History: Past Surgical History:  Procedure Laterality Date   CESAREAN SECTION  2021   WISDOM TOOTH EXTRACTION Bilateral     Family History: Family History  Problem Relation Age of Onset   Depression Mother    Healthy Sister    Healthy Sister    Healthy Brother    Healthy Brother    Healthy Brother    Congenital heart disease Daughter        pacemaker    Hypertension Maternal Grandmother    Diabetes Maternal Grandmother    Asthma Neg Hx    Cancer Neg Hx    Stroke Neg Hx     Social History: Social History   Tobacco Use   Smoking status: Never    Passive exposure: Never   Smokeless tobacco: Never  Vaping Use   Vaping Use: Never used  Substance Use Topics   Alcohol use: No   Drug use: No    Allergies: No Known Allergies  Meds:  Medications Prior to Admission  Medication Sig Dispense Refill Last Dose   aspirin EC 81 MG tablet Take 1 tablet (81 mg total) by mouth daily. Swallow whole. 30 tablet 12 01/03/2022 at 0900   guaiFENesin (MUCINEX PO) Take by mouth as needed.   01/02/2022   hydroxychloroquine (PLAQUENIL) 200 MG tablet TAKE 1 TABLET BY MOUTH TWICE A DAY 180 tablet 0 01/03/2022 at 0900   Prenatal Vit-Fe Fumarate-FA (M-NATAL PLUS) 27-1 MG TABS Take 1 tablet by mouth daily.   01/03/2022 at 0900    I have reviewed patient's Past Medical Hx, Surgical Hx, Family Hx, Social Hx, medications and allergies.   ROS:  Review of Systems  Constitutional:  Negative for chills and fever.  Gastrointestinal:  Negative for abdominal pain and nausea.  Musculoskeletal:  Negative for myalgias.  Neurological:  Negative for headaches.   Other systems negative  Physical Exam  Patient Vitals for the past 24 hrs:  Temp Pulse Resp SpO2 Height Weight  01/03/22 2141 (!) 97.5 F (36.4 C) -- 17 -- -- --  01/03/22 2134 -- 86 -- 100 % 5\' 7"  (1.702 m) 85.3 kg   Constitutional: Well-developed, well-nourished female in no acute distress.  Cardiovascular: normal rate  Respiratory: normal effort GI: Abd soft, non-tender, gravid appropriate for gestational age.   No rebound or guarding. MS: Extremities nontender, no edema, normal ROM Neurologic: Alert and oriented x 4.  GU: Neg CVAT.  PELVIC EXAM:  Dilation: Closed Effacement (%): 60 Cervical Position: Posterior Station: -3 Exam by:: [email or web address], CNM   FHT:  Baseline 130 , moderate  variability, accelerations present, no decelerations Contractions: q 3-5 mins Irregular    Labs: No results found for this or any previous visit (from the past 24 hour(s)). O/Positive/-- (06/19 1445)  Imaging:    MAU Course/MDM: I have reviewed the triage vital signs and the nursing notes.   Pertinent labs & imaging results that were available during my care of the patient were reviewed by me and considered in my medical decision making (see chart for details).      I have reviewed her medical records including past results, notes and treatments.   NST reviewed, has been reactive throughout, monitored for over one hour Patient felt fetal movement while here Frequent UCs but none painful  Consult Dr 05-12-2001 with presentation, exam findings and test results.  Treatments in MAU included EFM.    Assessment: Single IUP at [redacted]w[redacted]d Decreased fetal movement Frequent BH contractions  Plan: Discharge home Labor precautions and fetal kick counts Follow up in Office for prenatal visits and recheck Encouraged to return if she develops worsening of symptoms, increase in pain, fever, or other concerning symptoms.   Pt stable at time of discharge.  [redacted]w[redacted]d CNM, MSN Certified Nurse-Midwife 01/03/2022 10:04 PM

## 2022-01-04 ENCOUNTER — Ambulatory Visit: Payer: Medicaid Other

## 2022-01-04 ENCOUNTER — Encounter: Payer: Self-pay | Admitting: Family Medicine

## 2022-01-04 ENCOUNTER — Telehealth (INDEPENDENT_AMBULATORY_CARE_PROVIDER_SITE_OTHER): Payer: Medicaid Other | Admitting: Family Medicine

## 2022-01-04 DIAGNOSIS — O480 Post-term pregnancy: Secondary | ICD-10-CM

## 2022-01-04 DIAGNOSIS — O99891 Other specified diseases and conditions complicating pregnancy: Secondary | ICD-10-CM

## 2022-01-04 DIAGNOSIS — O0990 Supervision of high risk pregnancy, unspecified, unspecified trimester: Secondary | ICD-10-CM

## 2022-01-04 DIAGNOSIS — Z98891 History of uterine scar from previous surgery: Secondary | ICD-10-CM

## 2022-01-04 DIAGNOSIS — Z3A40 40 weeks gestation of pregnancy: Secondary | ICD-10-CM

## 2022-01-04 DIAGNOSIS — M329 Systemic lupus erythematosus, unspecified: Secondary | ICD-10-CM

## 2022-01-04 DIAGNOSIS — O34219 Maternal care for unspecified type scar from previous cesarean delivery: Secondary | ICD-10-CM

## 2022-01-04 NOTE — Progress Notes (Signed)
I connected with Cheryl Davila 01/04/22 at  9:35 AM EST by: MyChart video and verified that I am speaking with the correct person using two identifiers.  Patient is located at home and provider is located at [facility].     I discussed the limitations, risks, security and privacy concerns of performing an evaluation and management service by MyChart video and the availability of in person appointments. I also discussed with the patient that there may be a patient responsible charge related to this service. By engaging in this virtual visit, you consent to the provision of healthcare.  Additionally, you authorize for your insurance to be billed for the services provided during this visit.  The patient expressed understanding and agreed to proceed.  The following staff members participated in the virtual visit:  Venora Maples, MD/MPH Attending Family Medicine Physician, Faculty [facility], [facility]     PRENATAL VISIT NOTE  Subjective:  Cheryl Davila is a 26 y.o. G2P1001 at [redacted]w[redacted]d  for virtual visit for ongoing prenatal care.  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  Patient reports no complaints.  Contractions: Irritability. Vag. Bleeding: None.  Movement: Present. Denies leaking of fluid.   The following portions of the patient's history were reviewed and updated as appropriate: allergies, current medications, past family history, past medical history, past social history, past surgical history and problem list.   Objective:  There were no vitals filed for this visit. Self-Obtained  Fetal Status:     Movement: Present     Assessment and Plan:   Pregnancy: G2P1001 at [redacted]w[redacted]d 1. Supervision of high risk pregnancy, antepartum Reports decreased fetal movement, mostly just size of movements though frequency is close to normal Instructed to do kick counts immediately after our visit, if <6 in one hour she needs to go to MAU ASAP for eval and likely IOL of labor  2. SLE (systemic lupus erythematosus related syndrome) (HCC) On plaquenil and ASA Does not appear to have a more recent SSA/SSB Ab's since 04/2021, had very elevated SSA at that time and prior child reportedly had congenital heart block and also has suspected malformation on prenatal [place] in this pregnancy Following w MFM Counseled on IOL around 39 weeks but has previously declined, scheduled for three days from now on 01/07/2022  3. H/O cesarean section Signed TOLAC consent 10/20/2021  Term labor symptoms and general obstetric precautions including but not limited to vaginal bleeding, contractions, leaking of fluid and fetal movement were reviewed in detail with the patient.  Return in about 6 weeks (around 02/15/2022) for PP check.  Future Appointments  Date Time Provider Department Center  01/05/2022  2:00 PM [facility] NURSE [facility]  01/05/2022  2:15 PM [facility] NST [facility]  01/07/2022  6:45 AM MC-LD SCHED ROOM MC-INDC None  01/11/2022  9:35 AM Venora Maples, MD [facility]  01/11/2022 11:15 AM [facility] NST [facility]  03/16/2022  9:00 AM Gearldine Bienenstock, PA-C [facility] None     Time spent on virtual visit: 10 minutes  Venora Maples, MD

## 2022-01-05 ENCOUNTER — Ambulatory Visit: Payer: Medicaid Other | Attending: Obstetrics

## 2022-01-05 ENCOUNTER — Ambulatory Visit: Payer: Medicaid Other

## 2022-01-05 ENCOUNTER — Encounter (HOSPITAL_COMMUNITY): Payer: Self-pay | Admitting: Obstetrics and Gynecology

## 2022-01-05 ENCOUNTER — Inpatient Hospital Stay (HOSPITAL_COMMUNITY)
Admission: AD | Admit: 2022-01-05 | Discharge: 2022-01-05 | Disposition: A | Discharge status: Home / Self Care | Payer: Medicaid Other | Attending: Obstetrics and Gynecology | Admitting: Obstetrics and Gynecology

## 2022-01-05 DIAGNOSIS — O479 False labor, unspecified: Secondary | ICD-10-CM | POA: Diagnosis not present

## 2022-01-05 DIAGNOSIS — Z3A40 40 weeks gestation of pregnancy: Secondary | ICD-10-CM | POA: Insufficient documentation

## 2022-01-05 DIAGNOSIS — O471 False labor at or after 37 completed weeks of gestation: Secondary | ICD-10-CM | POA: Insufficient documentation

## 2022-01-05 NOTE — MAU Note (Signed)
...  Cheryl Davila is a 26 y.o. at [redacted]w[redacted]d here in MAU reporting: CTX since yesterday that have progressively gotten worse. She reports over the past two hours they have increased in intensity and are now every 4 minutes. Denies VB or LOF. +FM.  Onset of complaint: Yesterday Pain score: 6/10 lower abdomen  FHT: 133  initial external Lab orders placed from triage:  MAU Labor Eval

## 2022-01-05 NOTE — MAU Provider Note (Addendum)
Cheryl Davila is a G2P1001 at [redacted]w[redacted]d seen in MAU for labor. RN labor check, not seen by provider. SVE by RN Dilation: Closed Effacement (%): 70 Station: -3 Exam by:: [employer], RN   NST - FHR: 135 bpm / moderate variability / accels present / decels absent / TOCO: regular every 5-7 mins   Plan: Had long discussion with patient regarding recommendation for induction and offered induction tonight.  She declined induction and will come in on Saturday when she is scheduled in the collar in.  Told the patient that if she changes her mind and would like to be induced early she is free to come back. D/C home with labor precautions Keep scheduled induction on 01/07/22  Celedonio Savage, MD  01/05/2022 7:44 PM

## 2022-01-07 ENCOUNTER — Inpatient Hospital Stay (HOSPITAL_COMMUNITY): Payer: Medicaid Other | Admitting: Anesthesiology

## 2022-01-07 ENCOUNTER — Inpatient Hospital Stay (HOSPITAL_COMMUNITY)
Admission: AD | Admit: 2022-01-07 | Discharge: 2022-01-09 | DRG: 807 | Disposition: A | Discharge status: Home / Self Care | Payer: Medicaid Other | Attending: Family Medicine | Admitting: Family Medicine

## 2022-01-07 ENCOUNTER — Inpatient Hospital Stay (HOSPITAL_COMMUNITY)
Admission: RE | Admit: 2022-01-07 | Discharge: 2022-01-07 | Disposition: A | Discharge status: Home / Self Care | Payer: Medicaid Other | Source: Ambulatory Visit | Attending: Family Medicine | Admitting: Family Medicine

## 2022-01-07 ENCOUNTER — Encounter (HOSPITAL_COMMUNITY): Payer: Self-pay | Admitting: Obstetrics and Gynecology

## 2022-01-07 ENCOUNTER — Other Ambulatory Visit: Payer: Self-pay

## 2022-01-07 DIAGNOSIS — O26893 Other specified pregnancy related conditions, third trimester: Secondary | ICD-10-CM | POA: Diagnosis present

## 2022-01-07 DIAGNOSIS — O99892 Other specified diseases and conditions complicating childbirth: Secondary | ICD-10-CM | POA: Diagnosis present

## 2022-01-07 DIAGNOSIS — O34211 Maternal care for low transverse scar from previous cesarean delivery: Secondary | ICD-10-CM | POA: Diagnosis not present

## 2022-01-07 DIAGNOSIS — Z3A40 40 weeks gestation of pregnancy: Secondary | ICD-10-CM

## 2022-01-07 DIAGNOSIS — Z8249 Family history of ischemic heart disease and other diseases of the circulatory system: Secondary | ICD-10-CM | POA: Diagnosis not present

## 2022-01-07 DIAGNOSIS — M329 Systemic lupus erythematosus, unspecified: Secondary | ICD-10-CM | POA: Diagnosis present

## 2022-01-07 DIAGNOSIS — Z7982 Long term (current) use of aspirin: Secondary | ICD-10-CM | POA: Diagnosis not present

## 2022-01-07 DIAGNOSIS — O34219 Maternal care for unspecified type scar from previous cesarean delivery: Secondary | ICD-10-CM | POA: Diagnosis present

## 2022-01-07 DIAGNOSIS — R768 Other specified abnormal immunological findings in serum: Secondary | ICD-10-CM | POA: Diagnosis present

## 2022-01-07 DIAGNOSIS — O480 Post-term pregnancy: Principal | ICD-10-CM | POA: Diagnosis present

## 2022-01-07 DIAGNOSIS — Z98891 History of uterine scar from previous surgery: Secondary | ICD-10-CM

## 2022-01-07 DIAGNOSIS — O0990 Supervision of high risk pregnancy, unspecified, unspecified trimester: Secondary | ICD-10-CM

## 2022-01-07 LAB — CBC
HCT: 31.3 % — ABNORMAL LOW (ref 36.0–46.0)
Hemoglobin: 9.8 g/dL — ABNORMAL LOW (ref 12.0–15.0)
MCH: 22.9 pg — ABNORMAL LOW (ref 26.0–34.0)
MCHC: 31.3 g/dL (ref 30.0–36.0)
MCV: 73.1 fL — ABNORMAL LOW (ref 80.0–100.0)
Platelets: 281 10*3/uL (ref 150–400)
RBC: 4.28 MIL/uL (ref 3.87–5.11)
RDW: 16.7 % — ABNORMAL HIGH (ref 11.5–15.5)
WBC: 16.6 10*3/uL — ABNORMAL HIGH (ref 4.0–10.5)
nRBC: 0 % (ref 0.0–0.2)

## 2022-01-07 LAB — TYPE AND SCREEN
ABO/RH(D): O POS
Antibody Screen: NEGATIVE

## 2022-01-07 LAB — RPR: RPR Ser Ql: NONREACTIVE

## 2022-01-07 MED ORDER — LACTATED RINGERS IV SOLN: 500.0000 mL | INTRAVENOUS | Status: DC | PRN

## 2022-01-07 MED ORDER — TERBUTALINE SULFATE 1 MG/ML IJ SOLN: 0.2500 mg | Freq: Once | INTRAMUSCULAR | Status: DC | PRN

## 2022-01-07 MED ORDER — ONDANSETRON HCL 4 MG/2ML IJ SOLN
4.0000 mg | Freq: Four times a day (QID) | INTRAMUSCULAR | Status: DC | PRN
  Administered 2022-01-07: 4 mg via INTRAVENOUS
  Filled 2022-01-07: qty 2

## 2022-01-07 MED ORDER — LIDOCAINE HCL (PF) 1 % IJ SOLN: 30.0000 mL | INTRAMUSCULAR | Status: DC | PRN

## 2022-01-07 MED ORDER — OXYTOCIN-SODIUM CHLORIDE 30-0.9 UT/500ML-% IV SOLN
2.5000 [IU]/h | INTRAVENOUS | Status: DC
  Administered 2022-01-08: 2.5 [IU]/h via INTRAVENOUS
  Filled 2022-01-07: qty 500

## 2022-01-07 MED ORDER — EPHEDRINE 5 MG/ML INJ: 10.0000 mg | INTRAVENOUS | Status: DC | PRN

## 2022-01-07 MED ORDER — LACTATED RINGERS IV SOLN: 500.0000 mL | Freq: Once | INTRAVENOUS | Status: AC

## 2022-01-07 MED ORDER — FENTANYL-BUPIVACAINE-NACL 0.5-0.125-0.9 MG/250ML-% EP SOLN
12.0000 mL/h | EPIDURAL | Status: DC | PRN
  Administered 2022-01-07: 12 mL/h via EPIDURAL
  Filled 2022-01-07: qty 250

## 2022-01-07 MED ORDER — ACETAMINOPHEN 325 MG PO TABS
650.0000 mg | ORAL_TABLET | ORAL | Status: DC | PRN
  Administered 2022-01-08: 650 mg via ORAL
  Filled 2022-01-07: qty 2

## 2022-01-07 MED ORDER — LIDOCAINE HCL (PF) 1 % IJ SOLN
INTRAMUSCULAR | Status: DC | PRN
  Administered 2022-01-07: 11 mL via EPIDURAL

## 2022-01-07 MED ORDER — PHENYLEPHRINE 80 MCG/ML (10ML) SYRINGE FOR IV PUSH (FOR BLOOD PRESSURE SUPPORT): 80.0000 ug | PREFILLED_SYRINGE | INTRAVENOUS | Status: DC | PRN

## 2022-01-07 MED ORDER — DIPHENHYDRAMINE HCL 50 MG/ML IJ SOLN
25.0000 mg | Freq: Once | INTRAMUSCULAR | Status: AC
  Administered 2022-01-07: 25 mg via INTRAVENOUS
  Filled 2022-01-07: qty 1

## 2022-01-07 MED ORDER — OXYTOCIN-SODIUM CHLORIDE 30-0.9 UT/500ML-% IV SOLN
1.0000 m[IU]/min | INTRAVENOUS | Status: DC
  Administered 2022-01-07: 2 m[IU]/min via INTRAVENOUS

## 2022-01-07 MED ORDER — LACTATED RINGERS IV SOLN: INTRAVENOUS | Status: DC

## 2022-01-07 MED ORDER — DIPHENHYDRAMINE HCL 50 MG/ML IJ SOLN: 12.5000 mg | INTRAMUSCULAR | Status: DC | PRN

## 2022-01-07 MED ORDER — PHENYLEPHRINE 80 MCG/ML (10ML) SYRINGE FOR IV PUSH (FOR BLOOD PRESSURE SUPPORT)
80.0000 ug | PREFILLED_SYRINGE | INTRAVENOUS | Status: DC | PRN
  Filled 2022-01-07: qty 10

## 2022-01-07 MED ORDER — SOD CITRATE-CITRIC ACID 500-334 MG/5ML PO SOLN: 30.0000 mL | ORAL | Status: DC | PRN

## 2022-01-07 MED ORDER — BUPIVACAINE HCL (PF) 0.25 % IJ SOLN
INTRAMUSCULAR | Status: DC | PRN
  Administered 2022-01-07: 10 mL via EPIDURAL

## 2022-01-07 MED ORDER — OXYCODONE-ACETAMINOPHEN 5-325 MG PO TABS: 2.0000 | ORAL_TABLET | ORAL | Status: DC | PRN

## 2022-01-07 MED ORDER — OXYCODONE-ACETAMINOPHEN 5-325 MG PO TABS: 1.0000 | ORAL_TABLET | ORAL | Status: DC | PRN

## 2022-01-07 MED ORDER — FENTANYL CITRATE (PF) 100 MCG/2ML IJ SOLN: 100.0000 ug | INTRAMUSCULAR | Status: DC | PRN

## 2022-01-07 MED ORDER — OXYTOCIN BOLUS FROM INFUSION
333.0000 mL | Freq: Once | INTRAVENOUS | Status: AC
  Administered 2022-01-08: 333 mL via INTRAVENOUS

## 2022-01-07 NOTE — H&P (Cosign Needed Addendum)
OBSTETRIC ADMISSION HISTORY AND PHYSICAL  Cheryl Davila is a 26 y.o. female G2P1001 with IUP at [redacted]w[redacted]d by 9w [place] presenting for IOL for TOLAC and SLE. She reports irregular ctx, +FMs, No LOF, no VB, no blurry vision, headaches or peripheral edema, and RUQ pain.  She plans on Breast feeding. She is unsure about contraception.  She received her prenatal care at [facility]   Dating: By 9w [place] --->  Estimated Date of Delivery: 01/03/22  Sono:   @[redacted]w[redacted]d , CWD, normal anatomy, cephalic presentation, posterior fundal, 3597g, 89% EFW  Prenatal History/Complications:  -False positive RPR -SLE on plaquenil -H/o Cesarean section in G1 pregnancy due to fetal heart block. TOLAC success score 77%. -Family Hx heart block: baby with 3rd degree. Nl echo this pregnancy. Rec neonatal EKG.  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 Serum reacted with all reagent red cells in the antiglobulin phase of testing. Direct antiglobulin test performed and negative. If transfusion necessary anticipate difficulty in crossmatching.     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   Past Surgical History: Past Surgical History:  Procedure Laterality Date   CESAREAN SECTION  2021   WISDOM TOOTH EXTRACTION Bilateral    Obstetrical History: OB History     Gravida  2   Para  1   Term  1   Preterm      AB      Living  1      SAB      IAB      Ectopic      Multiple      Live Births  1          Social History Social History   Socioeconomic History   Marital status: Significant Other    Spouse name: Not on file   Number of children: Not on file   Years of education: Current Sr college   Highest education level: Some college, no degree  Occupational History    Comment: 2022: [employer]  Tobacco Use   Smoking status: Never    Passive exposure: Never   Smokeless tobacco: Never  Vaping  Use   Vaping Use: Never used  Substance and Sexual Activity   Alcohol use: No   Drug use: No   Sexual activity: Yes    Birth control/protection: None  Other Topics Concern   Not on file  Social History Narrative   Not on file   Social Determinants of Health   Financial Resource Strain: Low Risk  (04/01/2019)   Overall Financial Resource Strain (CARDIA)    Difficulty of Paying Living Expenses: Not hard at all  Food Insecurity: No Food Insecurity (01/07/2022)   Hunger Vital Sign    Worried About Running Out of Food in the Last Year: Never true    Ran Out of Food in the Last Year: Never true  Transportation Needs: No Transportation Needs (01/07/2022)   PRAPARE - 01/09/2022 (Medical): No    Lack of Transportation (Non-Medical): No  Physical Activity: Not on file  Stress: Stress Concern Present (04/01/2019)   05/30/2019 of Occupational Health - Occupational Stress Questionnaire    Feeling of Stress : To some extent  Social Connections: Not on file    Family History: Family History  Problem Relation Age of Onset   Depression Mother    Healthy Sister  Healthy Sister    Healthy Brother    Healthy Brother    Healthy Brother    Congenital heart disease Daughter        pacemaker   Hypertension Maternal Grandmother    Diabetes Maternal Grandmother    Asthma Neg Hx    Cancer Neg Hx    Stroke Neg Hx     Allergies: No Known Allergies  Medications Prior to Admission  Medication Sig Dispense Refill Last Dose   aspirin EC 81 MG tablet Take 1 tablet (81 mg total) by mouth daily. Swallow whole. 30 tablet 12 01/06/2022 at 0900   hydroxychloroquine (PLAQUENIL) 200 MG tablet TAKE 1 TABLET BY MOUTH TWICE A DAY 180 tablet 0 01/07/2022 at 0800   Prenatal Vit-Fe Fumarate-FA (M-NATAL PLUS) 27-1 MG TABS Take 1 tablet by mouth daily.   01/06/2022 at 0900     Review of Systems   All systems reviewed and negative except as stated in HPI  Blood  pressure 106/63, pulse 71, temperature 97.6 F (36.4 C), temperature source Oral, resp. rate 18, height 5\' 7"  (1.702 m), weight 85.2 kg, last menstrual period 04/15/2021, SpO2 100 %, unknown if currently breastfeeding. General appearance: alert, cooperative, and no distress Lungs: clear to auscultation bilaterally Heart: regular rate and rhythm Abdomen: soft, non-tender; bowel sounds normal Pelvic: adequate Extremities: Homans sign is negative, no sign of DVT Presentation: cephalic Fetal monitoringBaseline: 115-120 bpm, Variability: Good {> 6 bpm), Accelerations: Reactive, and Decelerations: Absent Uterine activityFrequency: Every 3-5 minutes  Dilation: 2.5 Effacement (%): 60 Station: -2 Exam by:: Nudule, MD  Prenatal labs: ABO, Rh: --/--/O POS (11/18 0745) Antibody: NEG (11/18 0745) Rubella: 2.34 (06/19 1445) RPR: Non Reactive (08/31 0907)  HBsAg: Negative (06/19 1445)  HIV: Non Reactive (08/31 0907)  GBS: Negative/-- (10/18 1452)  1 hr Glucola neg Genetic screening  normal Anatomy 11-04-1992 normal  Prenatal Transfer Tool  Maternal Diabetes: No Genetic Screening: Normal Maternal Ultrasounds/Referrals: Normal Fetal Ultrasounds or other Referrals:  Fetal echo, Referred to Materal Fetal Medicine  Maternal Substance Abuse:  No Significant Maternal Medications:  Meds include: Other: Plaquenil Significant Maternal Lab Results:  Group B Strep negative Number of Prenatal Visits:greater than 3 verified prenatal visits Other Comments:  None  Results for orders placed or performed during the hospital encounter of 01/07/22 (from the past 24 hour(s))  CBC   Collection Time: 01/07/22  7:45 AM  Result Value Ref Range   WBC 16.6 (H) 4.0 - 10.5 K/uL   RBC 4.28 3.87 - 5.11 MIL/uL   Hemoglobin 9.8 (L) 12.0 - 15.0 g/dL   HCT 01/09/22 (L) 36.1 - 44.3 %   MCV 73.1 (L) 80.0 - 100.0 fL   MCH 22.9 (L) 26.0 - 34.0 pg   MCHC 31.3 30.0 - 36.0 g/dL   RDW 15.4 (H) 00.8 - 67.6 %   Platelets 281 150 - 400  K/uL   nRBC 0.0 0.0 - 0.2 %  Type and screen   Collection Time: 01/07/22  7:45 AM  Result Value Ref Range   ABO/RH(D) O POS    Antibody Screen NEG    Sample Expiration      01/10/2022,2359 Performed at [facility], 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 G2P1001 at [redacted]w[redacted]d here for IOL for TOLAC and SLE.   #IOL: will proceed with low dose pit once pain is controlled #SLE on plaquenil: monitor for flair, tx with steroids if flair arises #H/o Cesarean section in G1 pregnancy due to fetal heart block. TOLAC success score 77%. #Family Hx heart block: baby with 3rd degree. Nl echo this pregnancy. Rec neonatal EKG.  #Pain: epidural #FWB: Cat 1 #ID:  GBS neg #MOF: Breast #MOC:Unsure #Circ:  No  Janeal Holmes, MD  01/07/2022, 11:17 AM  Fellow Attestation  I saw and evaluated the patient, performing the key elements of the service.I  personally performed or re-performed the history, physical exam, and medical decision making activities of this service and have verified that the service and findings are accurately documented in the resident's note. I developed the management plan that is described in the resident's note, and I agree with the content, with my edits above.   Alfredia Ferguson, MD,[facility] OB Fellow, Faculty Practice  01/07/2022 8:45 PM

## 2022-01-07 NOTE — Progress Notes (Signed)
Labor Progress Note Dailey Rainford is a 26 y.o. G2P1001 at [redacted]w[redacted]d presented for IOL for TOLAC and SLE.  S: Resting comfortably with epidural.  O:  BP 106/60   Pulse 83   Temp 98.1 F (36.7 C) (Oral)   Resp 18   Ht 5\' 7"  (1.702 m)   Wt 85.2 kg   LMP 04/15/2021 (Exact Date)   SpO2 100%   BMI 29.41 kg/m  EFM: 130s BPM/mod var/contractions every 3 minutes  CVE: Dilation: 8 Effacement (%): 90 Cervical Position: Posterior Station: Plus 1 Presentation: Vertex Exam by:: Dr. [email or web address]   A&P: 26 y.o. 22 [redacted]w[redacted]d here for IOL for TOLAC and SLE. #Labor: Progressing well. 8 cm dilated and good contraction pattern Will recheck in 1 hour and consider IUPC if no change.  #Pain: controlled with epidural #FWB: cat 1 #GBS negative #SLE on plaquenil, hx of heart block: consider neonatal EKG once delivered  [redacted]w[redacted]d, MD Center for [facility], [facility] 9:59 PM

## 2022-01-07 NOTE — Anesthesia Procedure Notes (Signed)
Epidural Patient location during procedure: OB Start time: 01/07/2022 8:58 AM End time: 01/07/2022 9:14 AM  Staffing Anesthesiologist: Lowella Curb, MD Performed: anesthesiologist   Preanesthetic Checklist Completed: patient identified, IV checked, site marked, risks and benefits discussed, surgical consent, monitors and equipment checked, pre-op evaluation and timeout performed  Epidural Patient position: sitting Prep: ChloraPrep Patient monitoring: heart rate, cardiac monitor, continuous pulse ox and blood pressure Approach: midline Location: L2-L3 Injection technique: LOR saline  Needle:  Needle type: Tuohy  Needle gauge: 17 G Needle length: 9 cm Needle insertion depth: 5 cm Catheter type: closed end flexible Catheter size: 20 Guage Catheter at skin depth: 9 cm Test dose: negative  Assessment Events: blood not aspirated, injection not painful, no injection resistance, no paresthesia and negative IV test  Additional Notes Epidural placed by SRNA under direct supervisionReason for block:procedure for pain

## 2022-01-07 NOTE — Anesthesia Preprocedure Evaluation (Signed)
Anesthesia Evaluation  Patient identified by MRN, date of birth, ID band Patient awake    Reviewed: Allergy & Precautions, H&P , NPO status , Patient's Chart, lab work & pertinent test results  Airway Mallampati: II  TM Distance: >3 FB Neck ROM: Full    Dental no notable dental hx.    Pulmonary neg pulmonary ROS   Pulmonary exam normal breath sounds clear to auscultation       Cardiovascular negative cardio ROS Normal cardiovascular exam Rhythm:Regular Rate:Normal     Neuro/Psych negative neurological ROS  negative psych ROS   GI/Hepatic negative GI ROS, Neg liver ROS,,,  Endo/Other  negative endocrine ROS    Renal/GU negative Renal ROS  negative genitourinary   Musculoskeletal negative musculoskeletal ROS (+)    Abdominal   Peds negative pediatric ROS (+)  Hematology negative hematology ROS (+)   Anesthesia Other Findings   Reproductive/Obstetrics (+) Pregnancy                             Anesthesia Physical Anesthesia Plan  ASA: 2  Anesthesia Plan: Epidural   Post-op Pain Management:    Induction:   PONV Risk Score and Plan:   Airway Management Planned:   Additional Equipment:   Intra-op Plan:   Post-operative Plan:   Informed Consent:   Plan Discussed with:   Anesthesia Plan Comments:        Anesthesia Quick Evaluation  

## 2022-01-07 NOTE — MAU Note (Signed)
Pt says she is an induction today  UC's strong since 0300- with bloody show PNC- clinic VE- closed on Wed - in MAU Denies HSV GBS- neg

## 2022-01-07 NOTE — Progress Notes (Signed)
Labor Progress Note Cheryl Davila is a 26 y.o. G2P1001 at [redacted]w[redacted]d presented for IOL for TOLAC and SLE.  S: Patient is resting comfortably now after epidural.  O:  BP 105/66   Pulse 99   Temp (!) 97.3 F (36.3 C) (Oral)   Resp 19   Ht 5\' 7"  (1.702 m)   Wt 85.2 kg   LMP 04/15/2021 (Exact Date)   SpO2 100%   BMI 29.41 kg/m  EFM: 130s BPM/mod var/contractions every 3 minutes  CVE: Dilation: 5.5 Effacement (%): 70 Cervical Position: Posterior Station: -2 Presentation: Vertex Exam by:: Ndulue, MD   A&P: 26 y.o. G2P1001 [redacted]w[redacted]d here for IOL for TOLAC and SLE. #Labor: Progressing well. Started pitocin and will assess improvement in 2 hours. Consider AROM if indicated at that time. #Pain: controlled with epidural #FWB: cat 1 #GBS negative #SLE on plaquenil, hx of heart block: consider neonatal EKG once delivered  [redacted]w[redacted]d, MD Center for [facility], [facility] 3:20 PM

## 2022-01-08 ENCOUNTER — Encounter (HOSPITAL_COMMUNITY): Payer: Self-pay | Admitting: Obstetrics & Gynecology

## 2022-01-08 DIAGNOSIS — O99892 Other specified diseases and conditions complicating childbirth: Secondary | ICD-10-CM

## 2022-01-08 DIAGNOSIS — M329 Systemic lupus erythematosus, unspecified: Secondary | ICD-10-CM

## 2022-01-08 DIAGNOSIS — O34219 Maternal care for unspecified type scar from previous cesarean delivery: Secondary | ICD-10-CM | POA: Insufficient documentation

## 2022-01-08 DIAGNOSIS — O480 Post-term pregnancy: Secondary | ICD-10-CM

## 2022-01-08 DIAGNOSIS — O34211 Maternal care for low transverse scar from previous cesarean delivery: Secondary | ICD-10-CM

## 2022-01-08 DIAGNOSIS — Z3A40 40 weeks gestation of pregnancy: Secondary | ICD-10-CM

## 2022-01-08 MED ORDER — DIBUCAINE (PERIANAL) 1 % EX OINT: 1.0000 | TOPICAL_OINTMENT | CUTANEOUS | Status: DC | PRN

## 2022-01-08 MED ORDER — SENNOSIDES-DOCUSATE SODIUM 8.6-50 MG PO TABS
2.0000 | ORAL_TABLET | ORAL | Status: DC
  Administered 2022-01-08 – 2022-01-09 (×2): 2 via ORAL
  Filled 2022-01-08 (×2): qty 2

## 2022-01-08 MED ORDER — TETANUS-DIPHTH-ACELL PERTUSSIS 5-2.5-18.5 LF-MCG/0.5 IM SUSY: 0.5000 mL | PREFILLED_SYRINGE | Freq: Once | INTRAMUSCULAR | Status: DC

## 2022-01-08 MED ORDER — ZOLPIDEM TARTRATE 5 MG PO TABS: 5.0000 mg | ORAL_TABLET | Freq: Every evening | ORAL | Status: DC | PRN

## 2022-01-08 MED ORDER — BENZOCAINE-MENTHOL 20-0.5 % EX AERO
1.0000 | INHALATION_SPRAY | CUTANEOUS | Status: DC | PRN
  Administered 2022-01-08: 1 via TOPICAL
  Filled 2022-01-08: qty 56

## 2022-01-08 MED ORDER — SIMETHICONE 80 MG PO CHEW: 80.0000 mg | CHEWABLE_TABLET | ORAL | Status: DC | PRN

## 2022-01-08 MED ORDER — ONDANSETRON HCL 4 MG PO TABS: 4.0000 mg | ORAL_TABLET | ORAL | Status: DC | PRN

## 2022-01-08 MED ORDER — WITCH HAZEL-GLYCERIN EX PADS
1.0000 | MEDICATED_PAD | CUTANEOUS | Status: DC | PRN
  Administered 2022-01-08: 1 via TOPICAL

## 2022-01-08 MED ORDER — ONDANSETRON HCL 4 MG/2ML IJ SOLN: 4.0000 mg | INTRAMUSCULAR | Status: DC | PRN

## 2022-01-08 MED ORDER — COCONUT OIL OIL
1.0000 | TOPICAL_OIL | Status: DC | PRN
  Administered 2022-01-09 (×2): 1 via TOPICAL

## 2022-01-08 MED ORDER — HYDROXYCHLOROQUINE SULFATE 200 MG PO TABS
200.0000 mg | ORAL_TABLET | Freq: Two times a day (BID) | ORAL | Status: DC
  Administered 2022-01-08 – 2022-01-09 (×3): 200 mg via ORAL
  Filled 2022-01-08 (×4): qty 1

## 2022-01-08 MED ORDER — ACETAMINOPHEN 325 MG PO TABS
650.0000 mg | ORAL_TABLET | ORAL | Status: DC | PRN
  Administered 2022-01-08 (×2): 650 mg via ORAL
  Filled 2022-01-08 (×2): qty 2

## 2022-01-08 MED ORDER — DIPHENHYDRAMINE HCL 25 MG PO CAPS: 25.0000 mg | ORAL_CAPSULE | Freq: Four times a day (QID) | ORAL | Status: DC | PRN

## 2022-01-08 MED ORDER — PRENATAL MULTIVITAMIN CH
1.0000 | ORAL_TABLET | Freq: Every day | ORAL | Status: DC
  Administered 2022-01-08 – 2022-01-09 (×2): 1 via ORAL
  Filled 2022-01-08 (×2): qty 1

## 2022-01-08 MED ORDER — IBUPROFEN 600 MG PO TABS
600.0000 mg | ORAL_TABLET | Freq: Four times a day (QID) | ORAL | Status: DC
  Administered 2022-01-08 – 2022-01-09 (×6): 600 mg via ORAL
  Filled 2022-01-08 (×6): qty 1

## 2022-01-08 NOTE — Progress Notes (Signed)
Labor Progress Note Yailen Heacox is a 26 y.o. G2P1001 at [redacted]w[redacted]d presented for IOL for TOLAC and SLE.  S: Continuing to rest comfortably with epidural in place  O:  BP 96/63   Pulse 86   Temp 98.9 F (37.2 C) (Oral)   Resp 18   Ht 5\' 7"  (1.702 m)   Wt 85.2 kg   LMP 04/15/2021 (Exact Date)   SpO2 100%   BMI 29.41 kg/m  EFM: 130s BPM/mod var/contractions every 3 minutes  CVE: Dilation: 8 Effacement (%): 90 Cervical Position: Posterior Station: Plus 1 Presentation: Vertex Exam by:: Dr. [email or web address]   A&P: 27 y.o. 22 [redacted]w[redacted]d here for IOL for TOLAC and SLE. #Labor: Progressing well. Remains 8 cm. IUPC placed. Recheck when adequate for at least one hour.  #Pain: controlled with epidural #FWB: cat 1 #GBS negative #SLE on plaquenil, hx of heart block: consider neonatal EKG once delivered  [redacted]w[redacted]d, MD Center for [facility], [facility] 12:12 AM

## 2022-01-08 NOTE — Anesthesia Postprocedure Evaluation (Signed)
Anesthesia Post Note  Patient: Cheryl Davila  Procedure(s) Performed: AN AD HOC LABOR EPIDURAL     Patient location during evaluation: Mother Baby Anesthesia Type: Epidural Level of consciousness: awake, awake and alert and oriented Pain management: pain level controlled Vital Signs Assessment: post-procedure vital signs reviewed and stable Respiratory status: spontaneous breathing Cardiovascular status: blood pressure returned to baseline Postop Assessment: no headache, no backache, patient able to bend at knees, able to ambulate, adequate PO intake and no apparent nausea or vomiting Anesthetic complications: no   No notable events documented.  Last Vitals:  Vitals:   01/08/22 0446 01/08/22 0601  BP: 108/60 105/68  Pulse: 81 75  Resp: 18 18  Temp: 36.7 C 36.7 C  SpO2:      Last Pain:  Vitals:   01/08/22 0707  TempSrc:   PainSc: 8    Pain Goal:                   Sonda Primes

## 2022-01-08 NOTE — Discharge Summary (Signed)
Postpartum Discharge Summary  Date of Service updated***     Patient Name: Cheryl Davila DOB: 06/09/1995 MRN: [id number]  Date of admission: 01/07/2022 Delivery date:01/08/2022  Delivering provider: Concepcion Living  Date of discharge: 01/08/2022  Admitting diagnosis: Systemic lupus complicating pregnancy ([place]) [O99.891, M32.9] Intrauterine pregnancy: [redacted]w[redacted]d    Secondary diagnosis:  Principal Problem:   Systemic lupus complicating pregnancy ([place] Active Problems:   Biological false positive RPR test   Supervision of high risk pregnancy, antepartum   SLE (systemic lupus erythematosus related syndrome) ([place]   H/O cesarean section   Family history of heart block   VBAC, delivered  Additional problems: None    Discharge diagnosis: Term Pregnancy Delivered and VBAC                                              Post partum procedures:{Postpartum procedures:23558} Augmentation: AROM and Pitocin Complications: None  Hospital course: Induction of Labor With Vaginal Delivery   26y.o. yo G2P1001 at 417w5das admitted to the hospital 01/07/2022 for induction of labor.  Indication for induction:  SLE .  Patient had an uncomplicated labor course Membrane Rupture Time/Date: 6:05 PM ,01/07/2022   Delivery Method:Vaginal, Spontaneous  Episiotomy: None  Lacerations:  2nd degree;Perineal  Details of delivery can be found in separate delivery note.  Patient had a postpartum course complicated by***. Patient is discharged home 01/08/22.  Newborn Data: Birth date:01/08/2022  Birth time:2:07 AM  Gender:Female  Living status:  Apgars: ,  Weight:   Magnesium Sulfate received: {Mag received:30440022} BMZ received: {BMZ received:30440023} Rhophylac:{Rhophylac received:30440032} MMOTR:{RNH:65790383}-DaP:{Tdap:23962} Flu: {F{FXO:32919}ransfusion:{Transfusion received:30440034}  Physical exam  Vitals:   01/07/22 2332 01/08/22 0000 01/08/22 0033 01/08/22 0222  BP: 96/63 115/84     Pulse: 86     Resp: 18 20    Temp:   99.2 F (37.3 C) (!) 100.5 F (38.1 C)  TempSrc:   Oral Oral  SpO2:      Weight:      Height:       General: {Exam; general:21111117} Lochia: {Desc; appropriate/inappropriate:30686::"appropriate"} Uterine Fundus: {Desc; firm/soft:30687} Incision: {Exam; incision:21111123} DVT Evaluation: {Exam; dvTYO:0600459}abs: Lab Results  Component Value Date   WBC 16.6 (H) 01/07/2022   HGB 9.8 (L) 01/07/2022   HCT 31.3 (L) 01/07/2022   MCV 73.1 (L) 01/07/2022   PLT 281 01/07/2022      Latest Ref Rng & Units 12/12/2021    9:29 AM  CMP  Glucose 65 - 99 mg/dL 79   BUN 7 - 25 mg/dL 4   Creatinine 0.50 - 0.96 mg/dL 0.51   Sodium 135 - 146 mmol/L 135   Potassium 3.5 - 5.3 mmol/L 4.2   Chloride 98 - 110 mmol/L 103   CO2 20 - 32 mmol/L 23   Calcium 8.6 - 10.2 mg/dL 8.7   Total Protein 6.1 - 8.1 g/dL 7.1   Total Bilirubin 0.2 - 1.2 mg/dL 0.3   AST 10 - 30 U/L 14   ALT 6 - 29 U/L 9    Edinburgh Score:     No data to display           After visit meds:  Allergies as of 01/08/2022   No Known Allergies   Med Rec must be completed prior to using this [facility]        Discharge  home in stable condition Infant Feeding: {Baby feeding:23562} Infant Disposition:{CHL IP OB HOME WITH XTAVWP:79480} Discharge instruction: per After Visit Summary and Postpartum booklet. Activity: Advance as tolerated. Pelvic rest for 6 weeks.  Diet: {OB XKPV:37482707} Future Appointments: Future Appointments  Date Time Provider [place]  01/11/2022  9:35 AM Clarnce Flock, MD [facility]  01/11/2022 11:15 AM [facility]  03/16/2022  9:00 AM Ofilia Neas, PA-C [facility] None   Follow up Visit:  Message sent   Please schedule this patient for a In person postpartum visit in 4 weeks with the following provider: Any provider. Additional Postpartum F/U: None    High risk pregnancy complicated by:  SLE Delivery mode:  Vaginal,  Spontaneous  Anticipated Birth Control:  Unsure   01/08/2022 Concepcion Living, MD

## 2022-01-08 NOTE — Plan of Care (Signed)
  Problem: Education: Goal: Knowledge of General Education information will improve Description: Including pain rating scale, medication(s)/side effects and non-pharmacologic comfort measures Outcome: Completed/Met

## 2022-01-08 NOTE — Lactation Note (Signed)
This note was copied from a baby's chart. Lactation Consultation Note  Patient Name: Boy Sayuri Rhames GQQPY'P Date: 01/08/2022 Reason for consult: L&D Initial assessment;Term Age:25 hours Birth Parent attempted latch infant on her left breast using the football hold, infant only held nipple in mouth and did not elicit the suck and swallows response. Afterwards Birth Parent did STS with infant as LC was leaving the room. Birth Parent will continue to work towards latching infant at the breast and will ask RN/LC on MBU for further latch assistance. Birth Parent knows to BF infant on demand, by cues, 8 to 12+ times within 24 hours, STS.  Maternal Data Has patient been taught Hand Expression?: Yes Does the patient have breastfeeding experience prior to this delivery?: Yes How long did the patient breastfeed?: Per Birth Parent, she pumped for 3 weeks postpartum due to her daughter having heart surgery  Feeding Mother's Current Feeding Choice: Breast Milk  LATCH Score Latch: Too sleepy or reluctant, no latch achieved, no sucking elicited.  Audible Swallowing: None  Type of Nipple: Everted at rest and after stimulation  Comfort (Breast/Nipple): Soft / non-tender  Hold (Positioning): Assistance needed to correctly position infant at breast and maintain latch.  LATCH Score: 5   Lactation Tools Discussed/Used    Interventions Interventions: Assisted with latch;Support pillows;Adjust position;Position options;Skin to skin;Education;Breast compression  Discharge    Consult Status Consult Status: Follow-up from L&D    Frederico Hamman 01/08/2022, 3:13 AM

## 2022-01-08 NOTE — Lactation Note (Signed)
This note was copied from a baby's chart. Lactation Consultation Note  Patient Name: Cheryl Davila SELTR'V Date: 01/08/2022 Reason for consult: Initial assessment;Term Age:25 hours  P2, Mother requested assistance with latching on R side in football hold.  Reviewed hand expression with drops easily expressed.  Baby latched with ease.  Discussed basics.  Mom made aware of O/P services, breastfeeding support groups, community resources, and our phone # for post-discharge questions.  Feed on demand with cues.  Goal 8-12+ times per day after first 24 hrs.  Place baby STS if not cueing.    Maternal Data Has patient been taught Hand Expression?: Yes Does the patient have breastfeeding experience prior to this delivery?: Yes  Feeding Mother's Current Feeding Choice: Breast Milk  LATCH Score Latch: Grasps breast easily, tongue down, lips flanged, rhythmical sucking.  Audible Swallowing: A few with stimulation  Type of Nipple: Everted at rest and after stimulation  Comfort (Breast/Nipple): Soft / non-tender  Hold (Positioning): Assistance needed to correctly position infant at breast and maintain latch.  LATCH Score: 8   Interventions Interventions: Assisted with latch;Skin to skin;Hand express;Education;LC Services brochure  Consult Status Consult Status: Follow-up Date: 01/09/22 Follow-up type: In-patient   [name] [facility] 01/08/2022, 8:03 AM

## 2022-01-09 NOTE — Lactation Note (Signed)
This note was copied from a baby's chart. Lactation Consultation Note  Patient Name: Cheryl Davila JMEQA'S Date: 01/09/2022 Reason for consult: Follow-up assessment;Term;Infant weight loss;Nipple pain/trauma (3 % weight loss, per mom latched this morning to sore to continue. Mom requested  for the [facility] to assess her sore nipples. LC noted both nipples to be pinky red,no breakdown noted. LC reviewed steps for latching and LC wrote a Step plan.) Age:25 hours LC reviewed BF D/C teaching, sore nipple tx , establishing milk supply and protecting volume. LC provided a LC written step plan due to soreness.  Maternal Data    Feeding Mother's Current Feeding Choice: Breast Milk  LATCH Score - LC unable to assess latch due to mom being to sore to latch.    Lactation Tools Discussed/Used Tools: Shells;Pump;Flanges;Coconut oil Flange Size: 21;24 Breast pump type: Manual Pump Education: Milk Storage Reason for Pumping: mom already has a hand pump and LC explained the flange sizing. Shells provided.  Interventions Interventions: Breast feeding basics reviewed;Hand pump;Education;LC Services brochure  Discharge Discharge Education: Engorgement and breast care;Warning signs for feeding baby;Outpatient recommendation;Other (comment) (If sore nipples not cleared up within 4 days to call for appt.) Pump: Manual;DEBP;Personal  Consult Status Consult Status: Complete Date: 01/09/22    Cheryl Davila Cheryl Davila 01/09/2022, 1:03 PM

## 2022-01-09 NOTE — Lactation Note (Signed)
This note was copied from a baby's chart. Lactation Consultation Note  Patient Name: Cheryl Davila NOIBB'C Date: 01/09/2022 Reason for consult: Mother's request;Term;Nipple pain/trauma Age:25 hours Baby is cluster feeding causing mom's nipples to hurt. Instructed to flange lips. Discussed BF positioning options, body alignment, support, STS, I&O. Mom stated after flanging lips feels better but is having to flange them frequently. Encouraged mom to rub her colostrum on nipples let air dry then apply coconut oil after BF.  Maternal Data    Feeding    LATCH Score Latch: Grasps breast easily, tongue down, lips flanged, rhythmical sucking.  Audible Swallowing: A few with stimulation  Type of Nipple: Everted at rest and after stimulation  Comfort (Breast/Nipple): Filling, red/small blisters or bruises, mild/mod discomfort (nipple pain and redness)  Hold (Positioning): Assistance needed to correctly position infant at breast and maintain latch.  LATCH Score: 7   Lactation Tools Discussed/Used Tools: Pump;Flanges;Coconut oil Flange Size: 21 Breast pump type: Manual  Interventions Interventions: Breast feeding basics reviewed;Adjust position;Assisted with latch;Support pillows;Skin to skin;Position options;Breast compression;Hand express  Discharge    Consult Status Consult Status: Follow-up Date: 01/09/22 Follow-up type: In-patient    [name] 01/09/2022, 1:34 AM

## 2022-01-11 ENCOUNTER — Other Ambulatory Visit: Payer: Self-pay

## 2022-01-11 ENCOUNTER — Encounter: Payer: Self-pay | Admitting: Family Medicine

## 2022-01-13 ENCOUNTER — Inpatient Hospital Stay (HOSPITAL_COMMUNITY)
Admission: AD | Admit: 2022-01-13 | Discharge: 2022-01-13 | Disposition: A | Discharge status: Home / Self Care | Payer: Medicaid Other | Attending: Obstetrics and Gynecology | Admitting: Obstetrics and Gynecology

## 2022-01-13 ENCOUNTER — Other Ambulatory Visit: Payer: Self-pay

## 2022-01-13 ENCOUNTER — Encounter (HOSPITAL_COMMUNITY): Payer: Self-pay | Admitting: Obstetrics and Gynecology

## 2022-01-13 ENCOUNTER — Encounter: Payer: Self-pay | Admitting: Family Medicine

## 2022-01-13 DIAGNOSIS — N939 Abnormal uterine and vaginal bleeding, unspecified: Secondary | ICD-10-CM | POA: Diagnosis not present

## 2022-01-13 NOTE — MAU Provider Note (Signed)
History     CSN: [id number]  Arrival date and time: 01/13/22 1954   Event Date/Time   First Provider Initiated Contact with Patient 01/13/22 2059      Chief Complaint  Patient presents with   Vaginal Bleeding   Patient is postpartum day 5 from an SVD presenting with vaginal bleeding.  Reports that she took a nap today and when she woke up she felt a plop and noticed a blood clot in her panties.  It was a little bit smaller than a pool ball.  This is the first time that this has occurred.  She has had light bleeding similar to menstrual bleeding since delivery.  Denies any signs of anemia such as lightheadedness, dizziness, weakness.    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    Past Surgical History:  Procedure Laterality Date   CESAREAN SECTION  2021   WISDOM TOOTH EXTRACTION Bilateral     Family History  Problem Relation Age of Onset   Depression Mother    Healthy Sister    Healthy Sister    Healthy Brother    Healthy Brother    Healthy Brother    Congenital heart disease Daughter        pacemaker   Hypertension Maternal Grandmother    Diabetes Maternal Grandmother    Asthma Neg Hx    Cancer Neg Hx    Stroke Neg Hx     Social History   Tobacco Use   Smoking status: Never    Passive exposure: Never   Smokeless tobacco: Never  Vaping Use   Vaping Use: Never used  Substance Use Topics   Alcohol use: No   Drug use: No    Allergies: No Known Allergies  Medications Prior to Admission  Medication Sig Dispense Refill Last Dose   hydroxychloroquine (PLAQUENIL) 200 MG tablet TAKE 1 TABLET BY MOUTH TWICE A DAY 180 tablet 0 01/13/2022   Prenatal Vit-Fe Fumarate-FA (M-NATAL PLUS) 27-1 MG TABS Take 1 tablet by mouth daily.    01/13/2022    Review of Systems  Constitutional:  Negative for chills and fever.  HENT:  Negative for congestion.   Eyes:  Negative for photophobia and visual disturbance.  Respiratory:  Negative for shortness of breath.   Cardiovascular:  Negative for chest pain.  Gastrointestinal:  Negative for abdominal pain, constipation, diarrhea, nausea and vomiting.  Endocrine: Negative for polyuria.  Genitourinary:  Positive for vaginal bleeding. Negative for pelvic pain.  Musculoskeletal:  Negative for myalgias.  All other systems reviewed and are negative.  Physical Exam   Blood pressure 110/71, pulse 72, temperature 98.3 F (36.8 C), temperature source Oral, resp. rate 18, height 5\' 7"  (1.702 m), weight 77.3 kg, SpO2 95 %, unknown if currently breastfeeding.  Physical Exam Vitals reviewed.  Constitutional:      Appearance: Normal appearance.  HENT:     Head: Normocephalic and atraumatic.  Eyes:     Extraocular Movements: Extraocular movements intact.  Cardiovascular:  Rate and Rhythm: Normal rate.     Pulses: Normal pulses.  Pulmonary:     Effort: Pulmonary effort is normal.  Abdominal:     General: Abdomen is flat. There is no distension.     Palpations: Abdomen is soft.     Tenderness: There is no abdominal tenderness.     Comments: Uterus is firm  Genitourinary:    General: Normal vulva.     Comments: No clots in the vaginal vault.  No bleeding appreciated from the cervix. Musculoskeletal:        General: Normal range of motion.  Skin:    General: Skin is warm.     Capillary Refill: Capillary refill takes less than 2 seconds.  Neurological:     General: No focal deficit present.     Mental Status: She is alert.  Psychiatric:        Mood and Affect: Mood normal.     MAU Course  Procedures  MDM Physical exam  Assessment and Plan  Patient is a 26 year old who is postpartum day 5 after SVD.  Had moderate size clot today.  Bleeding after the clot test has been  unremarkable and was light prior to the passing of the clots. Exam showing no clot in the vaginal vault.  Uterus was firm and nondistended.  Abdomen was soft and nontender.  Do not feel this is retained products and likely a buildup of clot over the past few days that suddenly passed.  Discussed that if the patient has any continued passing of clots, signs of anemia she should return immediately and will likely need blood work and an ultrasound to assess for retained products of conception.  Patient is agreeable to this.  Patient discharged home with no further questions or concerns.  Cyndi Lennert Cheryl Davila 01/13/2022, 9:00 PM

## 2022-01-13 NOTE — Discharge Instructions (Signed)
It was great seeing you. I do not feel that there is anything that needs to be done about your vaginal bleeding at this time other than monitoring. If you begin to see regular clots or have any signs of anemia, which we discussed, please return for further evaluation. I hope you have a wonderful night.

## 2022-01-13 NOTE — MAU Note (Signed)
.  Cheryl Davila is a 26 y.o. at Unknown here in MAU reporting: postpartum vaginal delivery on 11/19 - noticed a large blood clot today while using the bathroom - bleeding has stayed the same. No more clots since. Denies any pain.   Onset of complaint: 1830 Pain score: 0 Vitals:   01/13/22 2007  BP: 116/75  Pulse: 81  Resp: 18  Temp: 98.3 F (36.8 C)  SpO2: 100%      Lab orders placed from triage:  none

## 2022-01-17 ENCOUNTER — Telehealth (HOSPITAL_COMMUNITY): Payer: Self-pay | Admitting: *Deleted

## 2022-01-17 NOTE — Telephone Encounter (Signed)
Left phone voicemail message.  Duffy Rhody, RN 01-17-2022 at 11:25am

## 2022-02-15 ENCOUNTER — Telehealth: Payer: Self-pay

## 2022-02-15 MED ORDER — HYDROXYCHLOROQUINE SULFATE 200 MG PO TABS: 200.0000 mg | ORAL_TABLET | Freq: Two times a day (BID) | ORAL | 0 refills | Status: DC

## 2022-02-15 NOTE — Telephone Encounter (Signed)
Patient calling to ask what birth control pill was discussed so she can talk to [facility] doctor on Friday about it. She states this was discussed at an earlier visit and she gave birth in November. Also needs refills on Plaquenil. Eye exam done in the past 12 months.  Advised will have provider review this.

## 2022-02-15 NOTE — Telephone Encounter (Signed)
Dr. Corliss Skains previously discussed the use of an IUD vs. Progestin only oral contraception given her history of systemic lupus.    CMP from 10/23 and CBC from 11/18 was reviewed today in the office. Plaquenil eye exam on 09/27/21. Refill of plaquenil was sent to the pharmacy today.

## 2022-02-17 ENCOUNTER — Other Ambulatory Visit: Payer: Self-pay

## 2022-02-17 ENCOUNTER — Ambulatory Visit (INDEPENDENT_AMBULATORY_CARE_PROVIDER_SITE_OTHER): Payer: Medicaid Other | Admitting: Obstetrics and Gynecology

## 2022-02-17 ENCOUNTER — Encounter: Payer: Self-pay | Admitting: Obstetrics and Gynecology

## 2022-02-17 DIAGNOSIS — Z30011 Encounter for initial prescription of contraceptive pills: Secondary | ICD-10-CM

## 2022-02-17 MED ORDER — NORETHINDRONE 0.35 MG PO TABS: 1.0000 | ORAL_TABLET | Freq: Every day | ORAL | 11 refills | Status: DC

## 2022-02-17 NOTE — Progress Notes (Signed)
Post Partum Visit Note  Cheryl Davila is a 26 y.o. G24P2002 female who presents for a postpartum visit. She is  5.5  weeks postpartum following a TOLAC.  I have fully reviewed the prenatal and intrapartum course. The delivery was at 40.5 gestational weeks.  Anesthesia: epidural. Postpartum course has been uncomplicated. Baby is doing well. Baby is feeding by bottle - Enfamil Neuropro . Bleeding no bleeding. Bowel function is normal. Bladder function is normal. Patient is not sexually active. Contraception method is oral progesterone-only contraceptive. Postpartum depression screening: negative.   The pregnancy intention screening data noted above was reviewed. Potential methods of contraception were discussed. The patient elected to proceed with progesterone only pills.   Edinburgh Postnatal Depression Scale - 02/17/22 0947       Edinburgh Postnatal Depression Scale:  In the Past 7 Days   I have been able to laugh and see the funny side of things. 0    I have looked forward with enjoyment to things. 0    I have blamed myself unnecessarily when things went wrong. 0    I have been anxious or worried for no good reason. 1    I have felt scared or panicky for no good reason. 0    Things have been getting on top of me. 0    I have been so unhappy that I have had difficulty sleeping. 0    I have felt sad or miserable. 0    I have been so unhappy that I have been crying. 0    The thought of harming myself has occurred to me. 0    Edinburgh Postnatal Depression Scale Total 1             Health Maintenance Due  Topic Date Due   HPV VACCINES (1 - 2-dose series) Never done   PAP-Cervical Cytology Screening  12/10/2020    The following portions of the patient's history were reviewed and updated as appropriate: allergies, current medications, past family history, past medical history, past social history, past surgical history, and problem list.  Review of Systems Pertinent items are  noted in HPI.  Objective:  BP (!) 112/56   Pulse 66   Ht 5\' 7"  (1.702 m)   Wt 169 lb 6.4 oz (76.8 kg)   Breastfeeding No Comment: just stopped breastfeeding week of 02/17/22  BMI 26.53 kg/m    General:  alert, cooperative, and no distress   Breasts:  not indicated  Lungs: clear to auscultation bilaterally  Heart:  regular rate and rhythm  Abdomen: soft, non-tender; bowel sounds normal; no masses,  no organomegaly   Wound N/a  GU exam:  not indicated       Assessment:      normal postpartum exam.   Plan:   Essential components of care per ACOG recommendations:  1.  Mood and well being: Patient with negative depression screening today. Reviewed local resources for support.  - Patient tobacco use? No.   - hx of drug use? No.    2. Infant care and feeding:  -Patient currently breastmilk feeding? No.  -Social determinants of health (SDOH) reviewed in EPIC. No concerns.  3. Sexuality, contraception and birth spacing - Patient does not want a pregnancy in the next year.  Desired family size is 2 children.  - Reviewed reproductive life planning. Reviewed contraceptive methods based on pt preferences and effectiveness.  Patient desired Oral Contraceptive today.   - Discussed birth spacing of 18 months  4. Sleep and fatigue -Encouraged family/partner/community support of 4 hrs of uninterrupted sleep to help with mood and fatigue  5. Physical Recovery  - Discussed patients delivery and complications. She describes her labor as good. - Patient had a Vaginal, no problems at delivery. Patient had a 2nd degree laceration. Perineal healing reviewed. Patient expressed understanding - Patient has urinary incontinence? No. - Patient is safe to resume physical and sexual activity  6.  Health Maintenance - HM due items addressed Yes - Last pap smear No results found for: "DIAGPAP" Pap smear not done at today's visit. Pap found in care everywhere, pap done 02/01/21, WNL -Breast Cancer  screening indicated? No.   7. Chronic Disease/Pregnancy Condition follow up:  SLE, pt has follow up with rheumatologist in [place]  - PCP follow up  Warden Fillers, MD Center for [facility], [facility]

## 2022-03-02 NOTE — Progress Notes (Signed)
Office Visit Note  Patient: Cheryl Davila             Date of Birth: 06-03-1995           MRN: [id number]             PCP: [facility], [facility] Referring: [facility], [facility] Visit Date: 03/16/2022 Occupation: @GUAROCC @  Subjective:  Intermittent pain in both hands   History of Present Illness: Khamila Domanski is a 27 y.o. female with history of systemic lupus.  Patient remains on plaquenil 200 mg 1 tablet by mouth twice daily.  She is tolerating Plaquenil without any side effects.  Patient reports that she is no longer breast-feeding.  She states that she gave birth to her baby on 01/08/2022.  She states that her baby has only been waking up once in the night so she has been sleeping better than expected.  She states overall her energy level has been stable.  She states that she is back on birth control.  She denies any signs or symptoms of a systemic lupus flare.  She denies any recent rashes, hair loss, oral or nasal ulcerations, sicca symptoms, or swollen lymph nodes.  Patient reports that she has been experiencing sporadic pain in both hands lasting several hours at a time.  She states that when these episodes happen and is difficult for her to make a bottle.  She describes the pain in her hands as a burning sensation.  She has not noticed any color changes in her fingertips but will pay more attention.  She denies any numbness but has had some weakness in the hands.  She denies a history of carpal tunnel syndrome.  She has not taken any over-the-counter products for symptomatic relief.  She denies any other joint pain or joint swelling at this time.  Activities of Daily Living:  Patient reports morning stiffness for 10 minutes.   Patient Denies nocturnal pain.  Difficulty dressing/grooming: Denies Difficulty climbing stairs: Denies Difficulty getting out of chair: Denies Difficulty using hands for taps, buttons, cutlery, and/or writing: Denies  Review of Systems   Constitutional: Negative.  Negative for fatigue.  HENT: Negative.  Negative for mouth sores and mouth dryness.   Eyes: Negative.  Negative for dryness.  Respiratory: Negative.  Negative for shortness of breath.   Cardiovascular: Negative.  Negative for chest pain and palpitations.  Gastrointestinal: Negative.  Negative for blood in stool, constipation and diarrhea.  Endocrine: Negative for increased urination.  Genitourinary:  Negative for involuntary urination.  Musculoskeletal:  Positive for joint pain, joint pain and morning stiffness. Negative for gait problem, joint swelling, myalgias, muscle weakness, muscle tenderness and myalgias.  Skin: Negative.  Negative for color change, rash, hair loss and sensitivity to sunlight.  Allergic/Immunologic: Negative.  Negative for susceptible to infections.  Neurological:  Positive for headaches. Negative for dizziness.  Hematological: Negative.  Negative for swollen glands.  Psychiatric/Behavioral:  Negative for depressed mood and sleep disturbance. The patient is nervous/anxious.     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    Family History  Problem Relation Age of Onset   Depression Mother    Healthy Sister    Healthy Sister    Healthy Brother    Healthy Brother    Healthy Brother    Congenital heart disease Daughter        pacemaker   Hypertension Maternal Grandmother    Diabetes Maternal Grandmother    Asthma Neg Hx    Cancer  Neg Hx    Stroke Neg Hx    Past Surgical History:  Procedure Laterality Date   CESAREAN SECTION  2021   WISDOM TOOTH EXTRACTION Bilateral    Social History   Social History Narrative   Not on file   Immunization History  Administered Date(s) Administered   Tdap 08/29/2019, 10/20/2021     Objective: Vital Signs: BP 115/75 (BP Location: Right Arm, Patient Position: Sitting, Cuff Size: Normal)   Resp 16   Ht 5\' 7"  (1.702 m)   Wt 171 lb 3.2 oz (77.7 kg)   LMP  (LMP Unknown) Comment: Had baby in November  SpO2 98%   Breastfeeding No   BMI 26.81 kg/m    Physical Exam Vitals and nursing note reviewed.  Constitutional:      Appearance: She is well-developed.  HENT:     Head: Normocephalic and atraumatic.  Eyes:     Conjunctiva/sclera: Conjunctivae normal.  Cardiovascular:     Rate and Rhythm: Normal rate and regular rhythm.     Heart sounds: Normal heart sounds.  Pulmonary:     Effort: Pulmonary effort is normal.     Breath sounds: Normal breath sounds.  Abdominal:     General: Bowel sounds are normal.     Palpations: Abdomen is soft.  Musculoskeletal:     Cervical back: Normal range of motion.  Skin:    General: Skin is warm and dry.     Capillary Refill: Capillary refill takes less than 2 seconds.  Neurological:     Mental Status: She is alert and oriented to person, place, and time.  Psychiatric:        Behavior: Behavior normal.      Musculoskeletal Exam: C-spine, thoracic spine, lumbar spine have good range of motion.  No midline spinal tenderness.  No SI joint tenderness.  Shoulder joints, elbow joints, wrist joints, MCPs, PIPs, DIPs have good range of motion with no synovitis. Negative Phalen's test. Hip joints, knee joints, and ankle joints have good ROM with no discomfort.  No warmth or effusion of knee joints.  No tenderness or swelling of ankle joints.   CDAI Exam: CDAI Score: -- Patient Global: --; Provider Global: -- Swollen: --; Tender: -- Joint  Exam 03/16/2022   No joint exam has been documented for this visit   There is currently no information documented on the homunculus. Go to the Rheumatology activity and complete the homunculus joint exam.  Investigation: No additional findings.  Imaging: No results found.  Recent Labs: Lab Results  Component Value Date   WBC 16.6 (H) 01/07/2022   HGB 9.8 (L) 01/07/2022   PLT 281 01/07/2022   NA 135 12/12/2021   K 4.2 12/12/2021   CL 103 12/12/2021   CO2 23 12/12/2021   GLUCOSE 79 12/12/2021   BUN 4 (L) 12/12/2021   CREATININE 0.51 12/12/2021   BILITOT 0.3 12/12/2021   AST 14 12/12/2021   ALT 9 12/12/2021   PROT 7.1 12/12/2021   CALCIUM  8.7 12/12/2021    Speciality Comments: PLQ Eye Exam: 09/27/2021 WNL @ [facility] Follow up in 1 year  Procedures:  No procedures performed Allergies: Patient has no known allergies.    Assessment / Plan:     Visit Diagnoses: Other systemic lupus erythematosus with other organ involvement (HCC) - Positive ANA, positive SSA, positive lupus anticoagulant, history of fatigue, joint pain, chest tightness, fetal congenital heart block 2021: She has not had any signs or symptoms of a systemic lupus flare.  She has clinically been doing well taking Plaquenil 200 mg 1 tablet by mouth twice daily.  Patient reports that she has noticed doses intermittently but has been trying to remain compliant taking Plaquenil as prescribed.  Patient delivered her baby on 01/08/2022.  She is no longer breast-feeding and has been started on oral birth control. She has been experiencing intermittent pain in both hands which has been sporadic without a trigger.  She describes the pain as a burning sensation and weakness at times.  She has not taken any over-the-counter products for pain relief.  She has not noticed any joint swelling.  On examination today she had no joint tenderness or synovitis.  She has not noticed any color changes in her fingertips  consistent with Raynaud's phenomenon.  Negative Phalen's test on examination today.  If her symptoms persist or worsen we can update x-rays or perform an ultrasound to assess for synovitis.  She is not experiencing any other joint pain or joint swelling at this time.  Overall her energy level has been stable.  She has not had any recent oral or nasal ulcerations.  No sicca symptoms.  No cervical lymphadenopathy.  No facial rashes.  Discussed the importance of avoiding direct sun exposure and wearing sunscreen SPF greater than 50 on a daily basis. Lab work from 12/12/21 was reviewed today in the office: C4 WNL, C3 199, protein creatinine ratio WNL, ESR 92, dsDNA 2.  The following lab work will be updated today.  She will remain on Plaquenil as prescribed.  She was advised to notify us if she develops any new or worsening symptoms.  She will follow-up in the office in 3 months or sooner if needed. - Plan: COMPLETE METABOLIC PANEL WITH GFR, CBC with Differential/Platelet, Protein / creatinine ratio, urine, C3 and C4, Anti-DNA antibody, double-stranded, Sedimentation rate  Lupus anticoagulant positive - History of positive lupus anticoagulant.  Lupus anticoagulant was not detected on 08/18/2021.    High risk medication use - Plaquenil 200 mg 1 tablet by mouth twice daily- started in early April 2023.  PLQ Eye Exam: 09/27/2021 WNL @ [facility] Follow up in 1 year. CBC drawn on 01/07/2022.  CMP drawn on 12/12/2021.  Orders for CBC and CMP were released today. - Plan: COMPLETE METABOLIC PANEL WITH GFR, CBC with Differential/Platelet  Pain in both hands: She has been experiencing intermittent discomfort in both hands.  These episodes happen simultaneously in both hands to the point she has a burning sensation and occasional weakness to the point to where she drops objects.  She has not noticed any joint swelling or color changes in her fingertips.  She has not had an episode of hand pain in over 1  week.  She has been unable to identify a trigger for her symptoms.  On examination she has no joint tenderness or synovitis.  Discussed that if her symptoms persist or worsen we can update x-rays or ultrasound to assess for synovitis.  She will  notify us if she continues to have hand pain or swelling.  Other fatigue: Stable.   Heart block, fetal, affecting care of mother - Her daughter has a pacemaker.  She established care with maternal-fetal medicine on 09/01/2021.  Orders: Orders Placed This Encounter  Procedures   COMPLETE METABOLIC PANEL WITH GFR   CBC with Differential/Platelet   Protein / creatinine ratio, urine   C3 and C4   Anti-DNA antibody, double-stranded   Sedimentation rate   No orders of the defined types were placed in this encounter.     Follow-Up Instructions: Return in about 3 months (around 06/15/2022) for Systemic lupus erythematosus.   Gearldine Bienenstock, PA-C  Note - This record has been created using Dragon software.  Chart creation errors have been sought, but may not always  have been located. Such creation errors do not reflect on  the standard of medical care.

## 2022-03-16 ENCOUNTER — Encounter: Payer: Self-pay | Admitting: Physician Assistant

## 2022-03-16 ENCOUNTER — Ambulatory Visit: Payer: Medicaid Other | Attending: Physician Assistant | Admitting: Physician Assistant

## 2022-03-16 VITALS — BP 115/75 | Resp 16 | Ht 67.0 in | Wt 171.2 lb

## 2022-03-16 DIAGNOSIS — M79641 Pain in right hand: Secondary | ICD-10-CM

## 2022-03-16 DIAGNOSIS — Z79899 Other long term (current) drug therapy: Secondary | ICD-10-CM

## 2022-03-16 DIAGNOSIS — R760 Raised antibody titer: Secondary | ICD-10-CM | POA: Diagnosis not present

## 2022-03-16 DIAGNOSIS — O35BXX0 Maternal care for other (suspected) fetal abnormality and damage, fetal cardiac anomalies, not applicable or unspecified: Secondary | ICD-10-CM

## 2022-03-16 DIAGNOSIS — R5383 Other fatigue: Secondary | ICD-10-CM

## 2022-03-16 DIAGNOSIS — M3219 Other organ or system involvement in systemic lupus erythematosus: Secondary | ICD-10-CM | POA: Diagnosis not present

## 2022-03-16 DIAGNOSIS — M79642 Pain in left hand: Secondary | ICD-10-CM

## 2022-03-16 NOTE — Progress Notes (Signed)
Anemia has improved.

## 2022-03-16 NOTE — Progress Notes (Signed)
CMP WNL.  ESR has normalized.

## 2022-03-17 NOTE — Progress Notes (Signed)
Protein creatinine ratio WNL. Complements WNL.

## 2022-03-18 LAB — PROTEIN / CREATININE RATIO, URINE
Creatinine, Urine: 131 mg/dL (ref 20–275)
Protein/Creat Ratio: 84 mg/g creat (ref 24–184)
Protein/Creatinine Ratio: 0.084 mg/mg creat (ref 0.024–0.184)
Total Protein, Urine: 11 mg/dL (ref 5–24)

## 2022-03-18 LAB — CBC WITH DIFFERENTIAL/PLATELET
Absolute Monocytes: 493 cells/uL (ref 200–950)
Basophils Absolute: 58 cells/uL (ref 0–200)
Basophils Relative: 0.9 %
Eosinophils Absolute: 83 cells/uL (ref 15–500)
Eosinophils Relative: 1.3 %
HCT: 34 % — ABNORMAL LOW (ref 35.0–45.0)
Hemoglobin: 10.1 g/dL — ABNORMAL LOW (ref 11.7–15.5)
Lymphs Abs: 2438 cells/uL (ref 850–3900)
MCH: 22 pg — ABNORMAL LOW (ref 27.0–33.0)
MCHC: 29.7 g/dL — ABNORMAL LOW (ref 32.0–36.0)
MCV: 73.9 fL — ABNORMAL LOW (ref 80.0–100.0)
Monocytes Relative: 7.7 %
Neutro Abs: 3328 cells/uL (ref 1500–7800)
Neutrophils Relative %: 52 %
Platelets: 341 10*3/uL (ref 140–400)
RBC: 4.6 10*6/uL (ref 3.80–5.10)
RDW: 17.1 % — ABNORMAL HIGH (ref 11.0–15.0)
Total Lymphocyte: 38.1 %
WBC: 6.4 10*3/uL (ref 3.8–10.8)

## 2022-03-18 LAB — C3 AND C4
C3 Complement: 125 mg/dL (ref 83–193)
C4 Complement: 19 mg/dL (ref 15–57)

## 2022-03-18 LAB — COMPLETE METABOLIC PANEL WITH GFR
AG Ratio: 1.2 (calc) (ref 1.0–2.5)
ALT: 13 U/L (ref 6–29)
AST: 13 U/L (ref 10–30)
Albumin: 4.1 g/dL (ref 3.6–5.1)
Alkaline phosphatase (APISO): 78 U/L (ref 31–125)
BUN: 11 mg/dL (ref 7–25)
CO2: 24 mmol/L (ref 20–32)
Calcium: 9.4 mg/dL (ref 8.6–10.2)
Chloride: 108 mmol/L (ref 98–110)
Creat: 0.68 mg/dL (ref 0.50–0.96)
Globulin: 3.4 g/dL (calc) (ref 1.9–3.7)
Glucose, Bld: 80 mg/dL (ref 65–99)
Potassium: 4 mmol/L (ref 3.5–5.3)
Sodium: 140 mmol/L (ref 135–146)
Total Bilirubin: 0.3 mg/dL (ref 0.2–1.2)
Total Protein: 7.5 g/dL (ref 6.1–8.1)
eGFR: 124 mL/min/{1.73_m2} (ref >=60)

## 2022-03-18 LAB — SEDIMENTATION RATE: Sed Rate: 19 mm/h (ref 0–20)

## 2022-03-18 LAB — ANTI-DNA ANTIBODY, DOUBLE-STRANDED: ds DNA Ab: 3 IU/mL

## 2022-03-18 IMAGING — US US MFM OB LIMITED
2 series · 14 of 28 positions shown · non-contrast
Comparison: none

[Series 1: us mfm ob limited · 46 acquisitions, 13 frames shown (1 of 2)]
[im 2/46]
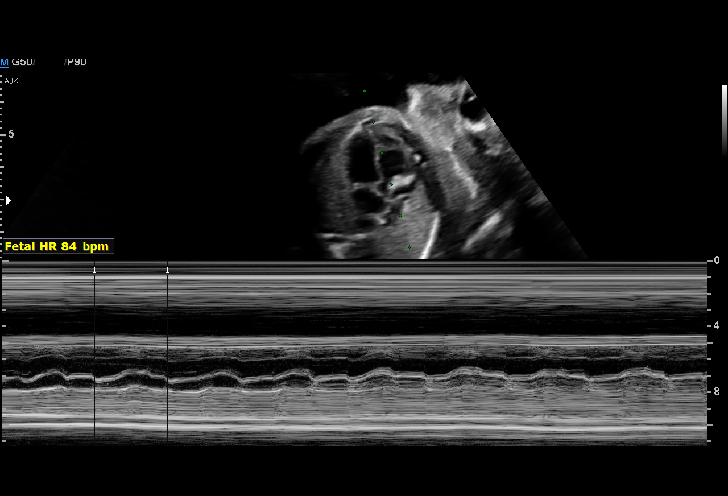
[im 6/46]
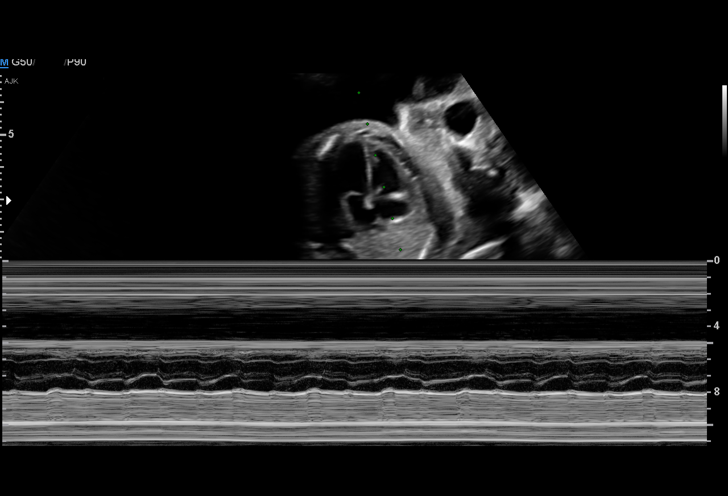
[im 9/46]
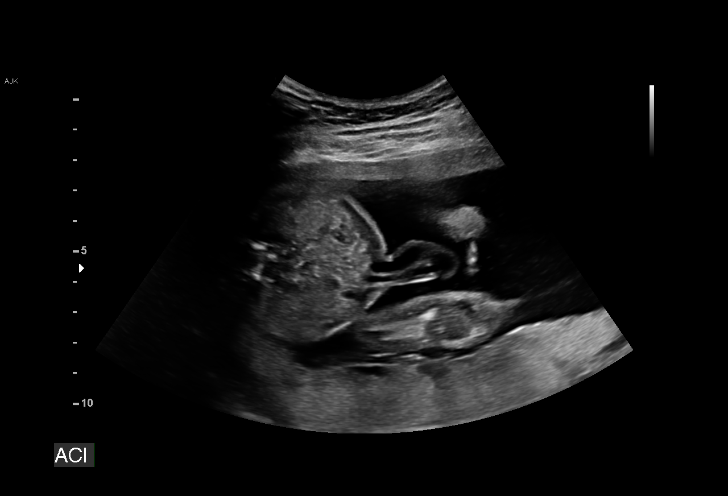
[im 13/46]
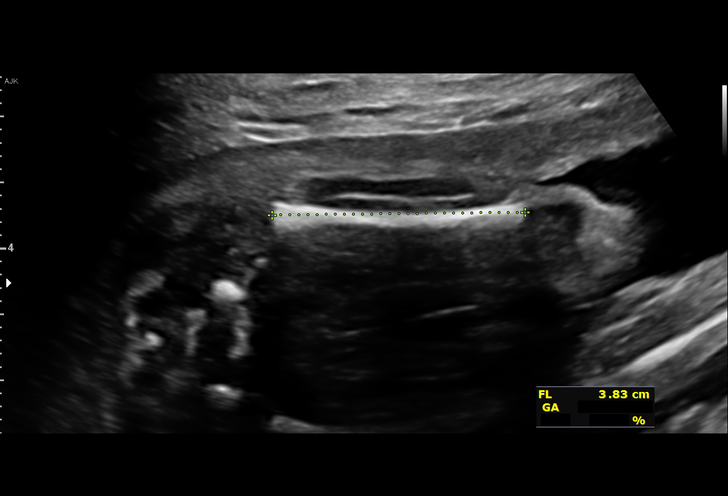
[im 16/46]
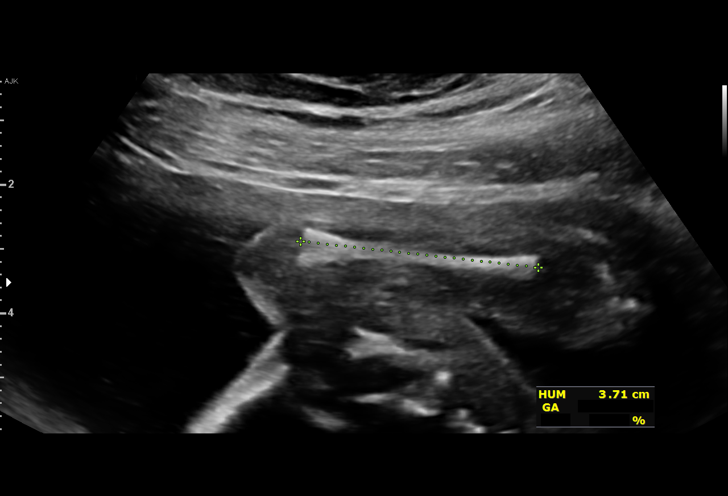
[im 20/46]
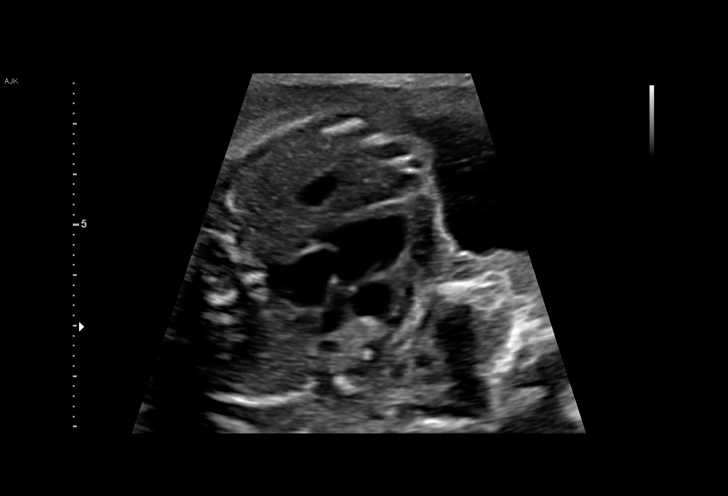
[im 23/46]
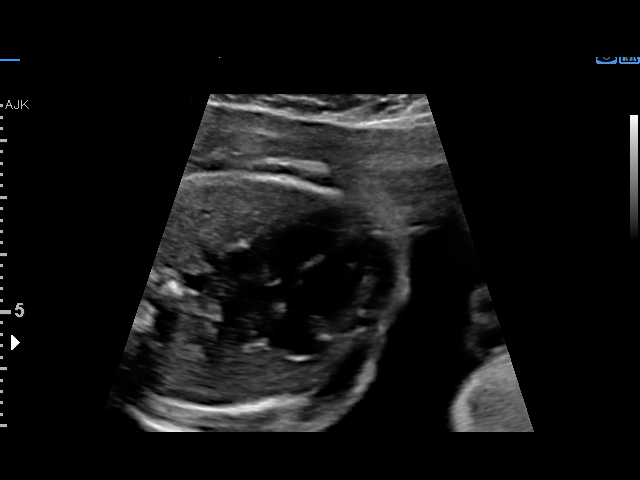
[im 26/46]
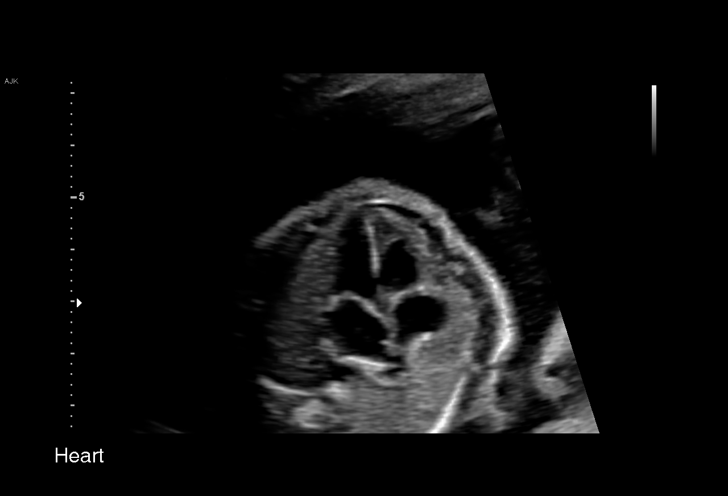
[im 30/46]
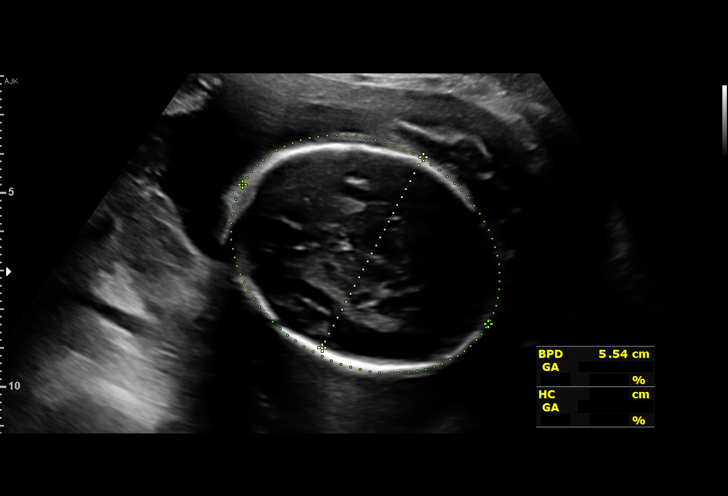
[im 33/46]
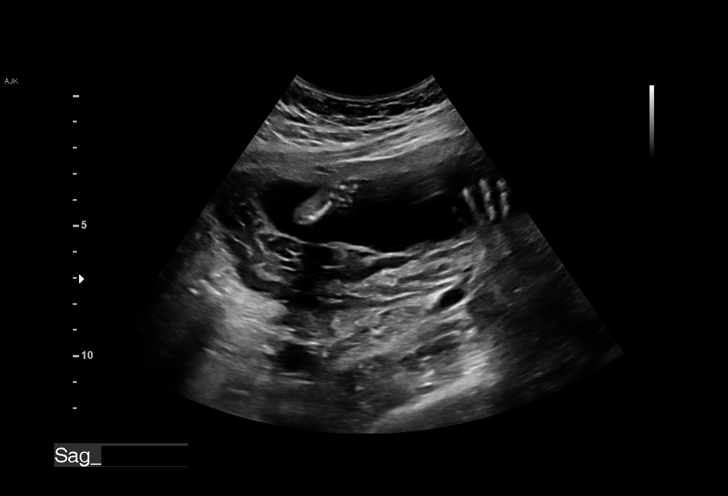
[im 37/46]
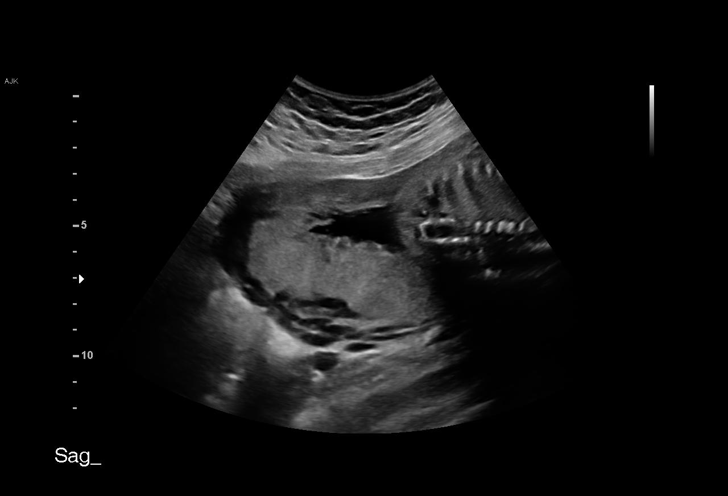
[im 40/46]
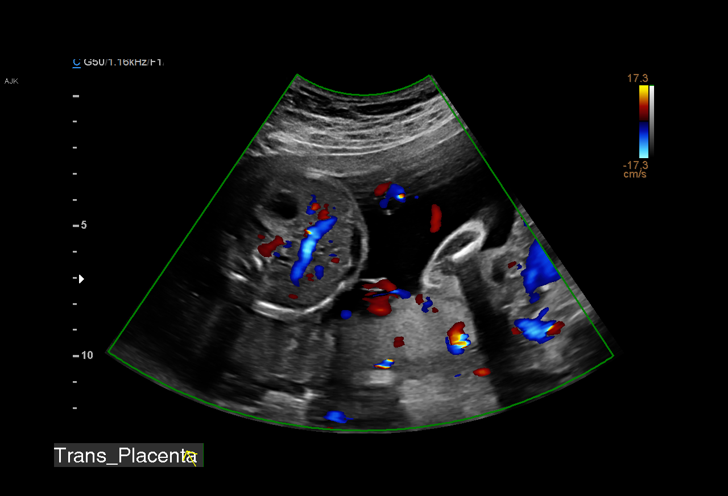
[im 44/46]
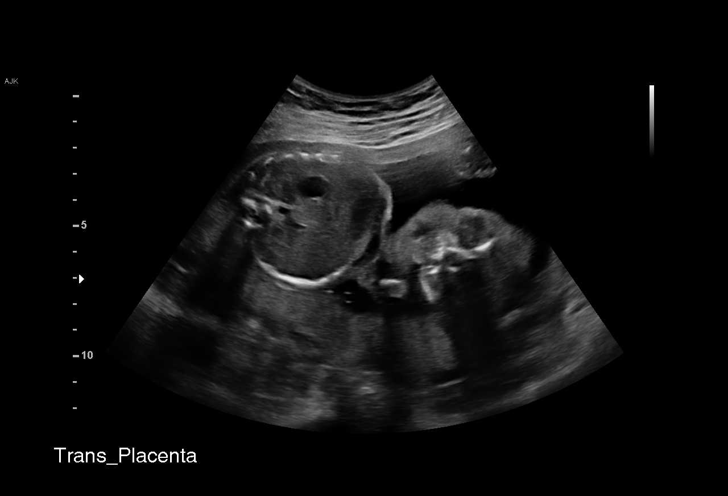

[Series 3: us mfm ob limited · 1 of 1 slices shown (2 of 2)]
[im 1/1]
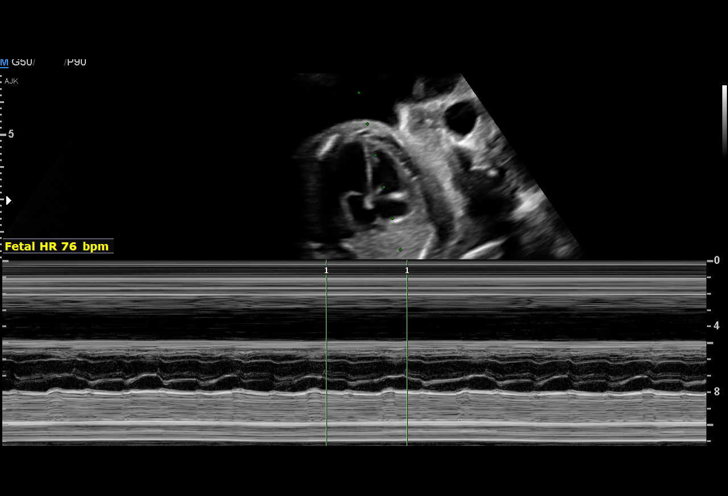

[14 of 28 positions shown; findings below may reference images not displayed]

----------------------------------------------------------------------

 ----------------------------------------------------------------------
Indications

  22 weeks gestation of pregnancy
  Fetal arrhythmia affecting pregnancy,
  antepartum
  Abnormality fetal heart rate or rhythm, 2nd
  trimester
 ----------------------------------------------------------------------
Fetal Evaluation

 Num Of Fetuses:          1
 Fetal Heart Rate(bpm):   84
 Cardiac Activity:        Arrhythmia noted
 Presentation:            Cephalic
 Placenta:                Posterior
 P. Cord Insertion:       Visualized

 Amniotic Fluid
 AFI FV:      Within normal limits

                             Largest Pocket(cm)


 Comment:    Called on call MFM Dr. Moatshe; he recommends fetal cardiology
             consult/echo.
Biometry

 BPD:      55.3  mm     G. Age:  22w 6d         52  %    CI:        72.48   %    70 - 86
                                                         FL/HC:       18.5  %    19.2 -
 HC:      206.6  mm     G. Age:  22w 5d         38  %    HC/AC:       1.06       1.05 -
 AC:      195.5  mm     G. Age:  24w 2d         86  %    FL/BPD:      69.3  %    71 - 87
 FL:       38.3  mm     G. Age:  22w 2d         25  %    FL/AC:       19.6  %    20 - 24
 HUM:      37.2  mm     G. Age:  23w 0d         51  %
 Est. FW:     581   gm     1 lb 4 oz     72  %
OB History

 Gravidity:    1         Term:   0        Prem:   0        SAB:   0
 TOP:          0       Ectopic:  0        Living: 0
Gestational Age

 U/S Today:     23w 0d                                        EDD:   10/15/19
 Best:          22w 5d     Det. By:  U/S  (05/21/19)          EDD:   10/17/19
Cervix Uterus Adnexa

 Cervix
 Length:           4.49  cm.
 Normal appearance by transabdominal scan.

 Uterus
 No abnormality visualized.

 Left Ovary
 Not visualized.

 Right Ovary
 Not visualized.

 Adnexa
 No abnormality visualized.
Impression

 Limited exam initially performed due to fetal arrhythmia
 Fetal measurements consistent with dates.
 Persistent fetal bradycardia, there is no evidence of fetal
 cardiac defect however, fetal echocardiogram has not been
 performed.
 The increased risk of congenital heart defect vs AV node
 defect vs PAC.
Recommendations

 Recommend consideration of maternal OLEXJOE/Harpal and SSB/La
 antibodies
 Recommend fetal echocardiogram with possible transfer of
 care tomorrow.

## 2022-03-20 NOTE — Progress Notes (Signed)
dsDNA is negative.  Labs are not consistent with a flare. Continue current dose of plaquenil

## 2022-06-01 NOTE — Progress Notes (Deleted)
Office Visit Note  Patient: Cheryl Davila             Date of Birth: August 23, 1995           MRN: [id number]             PCP: Medicine, [facility] Referring: Medicine, [facility]* Visit Date: 06/15/2022 Occupation: @GUAROCC @  Subjective:  No chief complaint on file.   History of Present Illness: Cheryl Davila is a 27 y.o. female ***     Activities of Daily Living:  Patient reports morning stiffness for *** {minute/hour:19697}.   Patient {ACTIONS;DENIES/REPORTS:21021675::"Denies"} nocturnal pain.  Difficulty dressing/grooming: {ACTIONS;DENIES/REPORTS:21021675::"Denies"} Difficulty climbing stairs: {ACTIONS;DENIES/REPORTS:21021675::"Denies"} Difficulty getting out of chair: {ACTIONS;DENIES/REPORTS:21021675::"Denies"} Difficulty using hands for taps, buttons, cutlery, and/or writing: {ACTIONS;DENIES/REPORTS:21021675::"Denies"}  No Rheumatology ROS completed.   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    Family History  Problem Relation Age of Onset   Depression Mother    Healthy Sister    Healthy Sister    Healthy Brother    Healthy Brother    Healthy Brother    Congenital heart disease Daughter        pacemaker   Hypertension Maternal Grandmother    Diabetes Maternal Grandmother    Asthma Neg Hx    Cancer Neg Hx    Stroke Neg Hx    Past Surgical History:  Procedure Laterality Date   CESAREAN SECTION  2021   WISDOM TOOTH EXTRACTION Bilateral    Social  History   Social History Narrative   Not on file   Immunization History  Administered Date(s) Administered   Tdap 08/29/2019, 10/20/2021     Objective: Vital Signs: There were no vitals taken for this visit.   Physical Exam   Musculoskeletal Exam: ***  CDAI Exam: CDAI Score: -- Patient Global: --; Provider Global: -- Swollen: --; Tender: -- Joint Exam 06/15/2022   No joint exam has been documented for this visit   There is currently no information documented on the homunculus. Go to the Rheumatology activity and complete the homunculus joint exam.  Investigation: No additional findings.  Imaging: No results found.  Recent Labs: Lab Results  Component Value Date   WBC 6.4 03/16/2022   HGB 10.1 (L) 03/16/2022   PLT 341 03/16/2022   NA 140 03/16/2022   K 4.0 03/16/2022   CL 108 03/16/2022   CO2 24 03/16/2022   GLUCOSE 80 03/16/2022   BUN 11 03/16/2022   CREATININE 0.68 03/16/2022   BILITOT 0.3 03/16/2022   AST 13 03/16/2022   ALT 13 03/16/2022   PROT 7.5 03/16/2022   CALCIUM 9.4 03/16/2022    Speciality Comments: PLQ Eye Exam: 09/27/2021 WNL @ [facility] Follow up in 1 year  Procedures:  No procedures performed Allergies: Patient has no known allergies.   Assessment / Plan:     Visit Diagnoses: Other systemic lupus erythematosus with other organ involvement  Lupus anticoagulant positive  High risk medication use  Other fatigue  Heart block, fetal,  affecting care of mother  Orders: No orders of the defined types were placed in this encounter.  No orders of the defined types were placed in this encounter.   Face-to-face time spent with patient was *** minutes. Greater than 50% of time was spent in counseling and coordination of care.  Follow-Up Instructions: No follow-ups on file.   Gearldine Bienenstock, PA-C  Note - This record has been created using Dragon software.  Chart creation errors have been sought, but may not always  have  been located. Such creation errors do not reflect on  the standard of medical care.

## 2022-06-12 ENCOUNTER — Other Ambulatory Visit: Payer: Self-pay | Admitting: Physician Assistant

## 2022-06-12 NOTE — Telephone Encounter (Signed)
Last Fill: 02/15/2022  Eye exam: 09/27/2021 WNL   Labs: 03/16/2022 Anemia has improved.  CMP WNL.   Next Visit: 06/15/2022  Last Visit: 03/16/2022  DX: Other systemic lupus erythematosus with other organ involvement   Current Dose per office note 03/16/2022: Plaquenil 200 mg 1 tablet by mouth twice daily   Okay to refill Plaquenil?

## 2022-06-15 ENCOUNTER — Ambulatory Visit: Payer: Medicaid Other | Admitting: Physician Assistant

## 2022-06-15 DIAGNOSIS — O35BXX0 Maternal care for other (suspected) fetal abnormality and damage, fetal cardiac anomalies, not applicable or unspecified: Secondary | ICD-10-CM

## 2022-06-15 DIAGNOSIS — Z79899 Other long term (current) drug therapy: Secondary | ICD-10-CM

## 2022-06-15 DIAGNOSIS — R760 Raised antibody titer: Secondary | ICD-10-CM

## 2022-06-15 DIAGNOSIS — M3219 Other organ or system involvement in systemic lupus erythematosus: Secondary | ICD-10-CM

## 2022-06-15 DIAGNOSIS — R5383 Other fatigue: Secondary | ICD-10-CM

## 2022-06-15 NOTE — Progress Notes (Unsigned)
Office Visit Note  Patient: Cheryl Davila             Date of Birth: [birth year]           MRN: [id number]             PCP: Medicine, [facility] Referring: Medicine, [facility]* Visit Date: 06/27/2022 Occupation: @GUAROCC @  Subjective:  Pain in both hands   History of Present Illness: Cheryl Davila is a 27 y.o. female with history of systemic lupus.  Patient is taking plaquenil 200 mg 1 tablet by mouth twice daily.  She is tolerating Plaquenil without any side effects and has not missed any doses recently.  Patient reports that she has noticed increased hair loss recently.  She is no longer breast-feeding and is no longer taking a prenatal vitamin.  She gave birth on 01/08/2022.  Patient reports that overall her energy level has been stable.  She states she continues to have pain in both hands especially on the tops of her hands.  She has difficulty performing any overuse or repetitive activities and feels that she has to shake out her hands during activities where she is gripping.  At times she has difficulty preparing bottles for her baby or even holding her phone.  She denies any swelling but describes the pain as an aching sensation on the top of her hands especially when she extends her wrists.  She states that the pain in her hands seems to worsen throughout the day and worsen with activity.  She has not tried taking any over-the-counter products for symptomatic relief.  She denies any other joint pain or joint swelling at this time.  She has intermittent sores in her mouth but no nasal ulcers.  She has not had any symptoms of a Raynaud's phenomenon recently.  She denies any swollen lymph nodes or sicca symptoms.  She has not had any recent rashes.    Activities of Daily Living:  Patient reports morning stiffness for 0 minutes.   Patient Reports nocturnal pain.  Difficulty dressing/grooming: Denies Difficulty climbing stairs: Denies Difficulty getting out of chair:  Denies Difficulty using hands for taps, buttons, cutlery, and/or writing: Reports  Review of Systems  Constitutional:  Positive for fatigue.  HENT:  Positive for mouth sores. Negative for mouth dryness.   Eyes:  Negative for dryness.  Respiratory:  Negative for shortness of breath.   Cardiovascular:  Positive for chest pain and palpitations.  Gastrointestinal:  Negative for blood in stool, constipation and diarrhea.  Endocrine: Negative for increased urination.  Genitourinary:  Negative for involuntary urination.  Musculoskeletal:  Positive for joint pain, joint pain and muscle weakness. Negative for gait problem, joint swelling, myalgias, morning stiffness, muscle tenderness and myalgias.  Skin:  Positive for color change and hair loss. Negative for rash and sensitivity to sunlight.  Allergic/Immunologic: Negative for susceptible to infections.  Neurological:  Negative for dizziness and headaches.  Hematological:  Negative for swollen glands.  Psychiatric/Behavioral:  Negative for depressed mood and sleep disturbance. The patient is not nervous/anxious.     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    Family History  Problem Relation Age of Onset   Depression Mother    Healthy Sister    Healthy Sister    Healthy Brother    Healthy Brother    Healthy Brother    Hypertension Maternal Grandmother    Diabetes Maternal Grandmother    Congenital heart disease Daughter         pacemaker   Healthy Son    Asthma Neg Hx    Cancer Neg Hx    Stroke Neg Hx    Past Surgical History:  Procedure Laterality Date   CESAREAN SECTION  2021   WISDOM TOOTH EXTRACTION Bilateral    Social History   Social History Narrative   Not on file   Immunization History  Administered Date(s) Administered   Tdap 08/29/2019, 10/20/2021     Objective: Vital Signs: BP 112/75 (BP Location: Left Arm, Patient Position: Sitting, Cuff Size: Normal)   Pulse 97   Resp 14   Ht 5\' 7"  (1.702 m)   Wt 176 lb (79.8 kg)   BMI 27.57 kg/m    Physical Exam Vitals and nursing note reviewed.  Constitutional:      Appearance: She is well-developed.  HENT:     Head: Normocephalic and atraumatic.  Eyes:     Conjunctiva/sclera: Conjunctivae normal.  Cardiovascular:     Rate and Rhythm: Normal rate and regular rhythm.     Heart sounds: Normal heart sounds.  Pulmonary:     Effort: Pulmonary effort is normal.     Breath sounds: Normal breath sounds.  Abdominal:     General: Bowel sounds are normal.     Palpations: Abdomen is soft.  Musculoskeletal:     Cervical back: Normal range of motion.  Lymphadenopathy:     Cervical: No cervical adenopathy.  Skin:    General: Skin is warm and dry.     Capillary Refill: Capillary refill takes less than 2 seconds.     Comments: No malar rash   Neurological:     Mental Status: She is alert and oriented to person, place, and time.  Psychiatric:        Behavior: Behavior normal.      Musculoskeletal Exam: C-spine, thoracic spine, lumbar spine have good range of motion.  Shoulder joints, elbow joints, wrist joints, MCPs, PIPs, DIPs have good range of motion with no synovitis.  Complete fist formation bilaterally.  Some tenderness over the extensor tendons on the dorsal aspect of both hands.  Hip joints have good range of motion with no groin pain.  Knee joints have good range of motion with no warmth or effusion.  Ankle joints have good range of motion  with no tenderness or joint swelling.  CDAI Exam: CDAI Score: -- Patient Global: --; Provider Global: -- Swollen: --; Tender: -- Joint Exam 06/27/2022   No joint exam has been documented for this visit   There is currently no information documented on the homunculus. Go to the Rheumatology activity and complete the homunculus joint exam.  Investigation: No additional findings.  Imaging: No results found.  Recent Labs: Lab Results  Component Value Date   WBC 6.4 03/16/2022   HGB 10.1 (L) 03/16/2022   PLT 341 03/16/2022   NA 140 03/16/2022   K 4.0 03/16/2022   CL 108 03/16/2022   CO2 24 03/16/2022   GLUCOSE 80 03/16/2022  BUN 11 03/16/2022   CREATININE 0.68 03/16/2022   BILITOT 0.3 03/16/2022   AST 13 03/16/2022   ALT 13 03/16/2022   PROT 7.5 03/16/2022   CALCIUM 9.4 03/16/2022    Speciality Comments: PLQ Eye Exam: 09/27/2021 WNL @ [facility] Follow up in 1 year  Procedures:  No procedures performed Allergies: Patient has no known allergies.     Assessment / Plan:     Visit Diagnoses: Other systemic lupus erythematosus with other organ involvement (HCC) - Positive ANA, positive SSA, positive lupus anticoagulant, history of fatigue, joint pain, chest tightness, fetal congenital heart block 2021: Patient presents today with ongoing pain in both hands especially in the extensor tendons bilaterally.  X-rays of both hands were updated today for further evaluation.  ESR, CRP, RF, anti-CCP were also checked.  She is not experiencing any other joint pain or inflammation at this time.  Her energy level has been stable overall.  She has noticed some increased hair loss but is no longer breast-feeding and no longer taking a prenatal vitamin.  Discussed that she can either restart prenatal vitamins or start taking Biotin.  She has not had any recent rashes.  No Malar rash was noted.  She has occasional sores in her mouth but none currently.  No nasal ulcers.  No sicca  symptoms.  No cervical lymphadenopathy. Lab work from 03/16/22 was reviewed today in the office: dsDNA is negative, complements WNL, ESR WNL, CMP WNL, anemia stable, and protein creatinine ratio WNL. The following lab work will be updated today.  She will remain on Plaquenil as prescribed.  A prednisone taper starting 20 mg tapering by 5 mg every 4 days was sent to the pharmacy today to alleviate her current symptoms.  Advised to notify us if her hand pain persists or worsens.  She will follow-up in the office in 3 months or sooner if needed.  - Plan: CBC with Differential/Platelet, COMPLETE METABOLIC PANEL WITH GFR, Protein / creatinine ratio, urine, C3 and C4, Anti-DNA antibody, double-stranded, Lupus Anticoagulant Eval w/Reflex  Lupus anticoagulant positive -Lupus anticoagulant rechecked today.  Plan: Lupus Anticoagulant Eval w/Reflex  High risk medication use - Plaquenil 200 mg 1 tablet by mouth twice daily- started in early April 2023.  PLQ Eye Exam: 09/27/2021 WNL @ [facility] Follow up in 1 year.  Patient was given a Plaquenil eye examination form to take with her to her upcoming appointment. CBC and CMP updated on 03/16/22.  Orders for CBC and CMP were released today.  - Plan: CBC with Differential/Platelet, COMPLETE METABOLIC PANEL WITH GFR  Pain in both hands -She presents today with ongoing pain in both hands which has been occurring on a daily basis.  She has difficulty performing any overuse or repetitive activities.  She has not been experiencing any hand cramping but has noticed decreased grip strength at times.  She has not had any numbness or tingling in her hands.  Most of her discomfort is on the dorsal aspect of both hands over the extensor tendons.  No obvious extensor tenosynovitis was noted.  No tenderness or synovitis over the MCP or PIP joints noted.  Discussed the concern for possible extensor tenosynovitis.  Different diagnostic evaluation tools were discussed today  including ultrasound, x-rays, and a possible MRI in the future if needed.  X-rays of both hands were updated today along with the following lab work for further evaluation.   Plan to try a course of prednisone 20 mg tapering by  5 mg every 4 days.  She will notify us if her symptoms persist or worsen. Plan: Sedimentation rate, XR Hand 2 View Left, XR Hand 2 View Right, Rheumatoid factor, Cyclic citrul peptide antibody, IgG, C-reactive protein  Other fatigue: Stable.   Heart block, fetal, affecting care of mother - - Her daughter has a pacemaker.  She established care with maternal-fetal medicine on 09/01/2021.  Orders: Orders Placed This Encounter  Procedures   XR Hand 2 View Left   XR Hand 2 View Right   CBC with Differential/Platelet   COMPLETE METABOLIC PANEL WITH GFR   Protein / creatinine ratio, urine   C3 and C4   Anti-DNA antibody, double-stranded   Sedimentation rate   Rheumatoid factor   Cyclic citrul peptide antibody, IgG   C-reactive protein   Lupus Anticoagulant Eval w/Reflex   Meds ordered this encounter  Medications   predniSONE (DELTASONE) 5 MG tablet    Sig: Take 4 tablets by mouth daily x4 days, 3 tablets daily x4 days, 2 tablets daily x4 days, 1 tablet daily x4 days.    Dispense:  40 tablet    Refill:  0     Follow-Up Instructions: Return in about 3 months (around 09/27/2022) for Systemic lupus erythematosus.   Gearldine Bienenstock, PA-C  Note - This record has been created using Dragon software.  Chart creation errors have been sought, but may not always  have been located. Such creation errors do not reflect on  the standard of medical care.

## 2022-06-27 ENCOUNTER — Ambulatory Visit: Payer: Medicaid Other | Attending: Physician Assistant | Admitting: Physician Assistant

## 2022-06-27 ENCOUNTER — Ambulatory Visit: Payer: Medicaid Other

## 2022-06-27 ENCOUNTER — Ambulatory Visit (INDEPENDENT_AMBULATORY_CARE_PROVIDER_SITE_OTHER): Payer: Medicaid Other

## 2022-06-27 ENCOUNTER — Encounter: Payer: Self-pay | Admitting: Physician Assistant

## 2022-06-27 VITALS — BP 112/75 | HR 97 | Resp 14 | Ht 67.0 in | Wt 176.0 lb

## 2022-06-27 DIAGNOSIS — M79642 Pain in left hand: Secondary | ICD-10-CM | POA: Diagnosis not present

## 2022-06-27 DIAGNOSIS — M79641 Pain in right hand: Secondary | ICD-10-CM | POA: Diagnosis not present

## 2022-06-27 DIAGNOSIS — R760 Raised antibody titer: Secondary | ICD-10-CM | POA: Diagnosis not present

## 2022-06-27 DIAGNOSIS — O35BXX0 Maternal care for other (suspected) fetal abnormality and damage, fetal cardiac anomalies, not applicable or unspecified: Secondary | ICD-10-CM

## 2022-06-27 DIAGNOSIS — M3219 Other organ or system involvement in systemic lupus erythematosus: Secondary | ICD-10-CM | POA: Diagnosis not present

## 2022-06-27 DIAGNOSIS — Z79899 Other long term (current) drug therapy: Secondary | ICD-10-CM | POA: Diagnosis not present

## 2022-06-27 DIAGNOSIS — R5383 Other fatigue: Secondary | ICD-10-CM

## 2022-06-27 MED ORDER — PREDNISONE 5 MG PO TABS: ORAL_TABLET | ORAL | 0 refills | Status: DC

## 2022-06-27 NOTE — Progress Notes (Signed)
X-rays of both hands are unremarkable.  Please notify the patient.

## 2022-06-28 NOTE — Progress Notes (Signed)
Complements WNL

## 2022-06-28 NOTE — Progress Notes (Signed)
Patient remains anemic-stable.  Glucose is 121. Total protein is borderline elevated. Rest of CMP WNL.  ESR WNL. CRP WNL. RF remains positive but is a lower titer. No proteinuria.

## 2022-06-29 ENCOUNTER — Other Ambulatory Visit: Payer: Self-pay | Admitting: Obstetrics and Gynecology

## 2022-06-29 NOTE — Progress Notes (Signed)
Anti-CCP negative.

## 2022-06-29 NOTE — Progress Notes (Signed)
dsDNA is negative.  Labs are not consistent with a lupus flare.

## 2022-07-02 LAB — PROTEIN / CREATININE RATIO, URINE
Creatinine, Urine: 50 mg/dL (ref 20–275)
Protein/Creat Ratio: 40 mg/g creat (ref 24–184)
Protein/Creatinine Ratio: 0.04 mg/mg creat (ref 0.024–0.184)
Total Protein, Urine: 2 mg/dL — ABNORMAL LOW (ref 5–24)

## 2022-07-02 LAB — CBC WITH DIFFERENTIAL/PLATELET
Absolute Monocytes: 449 cells/uL (ref 200–950)
Basophils Absolute: 70 cells/uL (ref 0–200)
Basophils Relative: 0.8 %
Eosinophils Absolute: 26 cells/uL (ref 15–500)
Eosinophils Relative: 0.3 %
HCT: 33.7 % — ABNORMAL LOW (ref 35.0–45.0)
Hemoglobin: 10 g/dL — ABNORMAL LOW (ref 11.7–15.5)
Lymphs Abs: 2482 cells/uL (ref 850–3900)
MCH: 20.7 pg — ABNORMAL LOW (ref 27.0–33.0)
MCHC: 29.7 g/dL — ABNORMAL LOW (ref 32.0–36.0)
MCV: 69.8 fL — ABNORMAL LOW (ref 80.0–100.0)
MPV: 12.3 fL (ref 7.5–12.5)
Monocytes Relative: 5.1 %
Neutro Abs: 5773 cells/uL (ref 1500–7800)
Neutrophils Relative %: 65.6 %
Platelets: 356 10*3/uL (ref 140–400)
RBC: 4.83 10*6/uL (ref 3.80–5.10)
RDW: 17.8 % — ABNORMAL HIGH (ref 11.0–15.0)
Total Lymphocyte: 28.2 %
WBC: 8.8 10*3/uL (ref 3.8–10.8)

## 2022-07-02 LAB — C3 AND C4
C3 Complement: 137 mg/dL (ref 83–193)
C4 Complement: 19 mg/dL (ref 15–57)

## 2022-07-02 LAB — COMPLETE METABOLIC PANEL WITH GFR
AG Ratio: 1.3 (calc) (ref 1.0–2.5)
ALT: 18 U/L (ref 6–29)
AST: 17 U/L (ref 10–30)
Albumin: 4.7 g/dL (ref 3.6–5.1)
Alkaline phosphatase (APISO): 85 U/L (ref 31–125)
BUN: 9 mg/dL (ref 7–25)
CO2: 25 mmol/L (ref 20–32)
Calcium: 9.8 mg/dL (ref 8.6–10.2)
Chloride: 103 mmol/L (ref 98–110)
Creat: 0.76 mg/dL (ref 0.50–0.96)
Globulin: 3.5 g/dL (calc) (ref 1.9–3.7)
Glucose, Bld: 121 mg/dL — ABNORMAL HIGH (ref 65–99)
Potassium: 4.1 mmol/L (ref 3.5–5.3)
Sodium: 137 mmol/L (ref 135–146)
Total Bilirubin: 0.4 mg/dL (ref 0.2–1.2)
Total Protein: 8.2 g/dL — ABNORMAL HIGH (ref 6.1–8.1)
eGFR: 111 mL/min/{1.73_m2} (ref >=60)

## 2022-07-02 LAB — LUPUS ANTICOAGULANT EVAL W/ REFLEX
PTT-LA Screen: 34 s (ref <=40)
dRVVT: 35 s (ref <=45)

## 2022-07-02 LAB — CYCLIC CITRUL PEPTIDE ANTIBODY, IGG: Cyclic Citrullin Peptide Ab: <16 UNITS

## 2022-07-02 LAB — C-REACTIVE PROTEIN: CRP: <3 mg/L (ref <8.0)

## 2022-07-02 LAB — SEDIMENTATION RATE: Sed Rate: 19 mm/h (ref 0–20)

## 2022-07-02 LAB — ANTI-DNA ANTIBODY, DOUBLE-STRANDED: ds DNA Ab: 2 IU/mL

## 2022-07-02 LAB — RHEUMATOID FACTOR: Rheumatoid fact SerPl-aCnc: 23 IU/mL — ABNORMAL HIGH (ref <14)

## 2022-07-03 NOTE — Progress Notes (Signed)
Lupus anticoagulant not detected.

## 2022-08-10 ENCOUNTER — Other Ambulatory Visit: Payer: Self-pay | Admitting: *Deleted

## 2022-08-10 MED ORDER — PREDNISONE 5 MG PO TABS: ORAL_TABLET | ORAL | 0 refills | Status: DC

## 2022-08-10 NOTE — Telephone Encounter (Signed)
Patient advised ok to send in prednisone taper but please schedule sooner office visit to discuss other treatment options.  Patient scheduled for 08/17/2022 at 9:00 am.    Patient advised to take prednisone in the morning with food and avoid the use of NSAIDs.

## 2022-08-10 NOTE — Addendum Note (Signed)
Addended by: Henriette Combs on: 08/10/2022 01:02 PM   Modules accepted: Orders

## 2022-08-10 NOTE — Telephone Encounter (Signed)
Patient contacted the office and states she recently had a prednisone taper. Patient states she has started having pain in her hands again. Patient states the pain started a few weeks after stopping the prednisone. Patient would like to know if she can get another prednisone taper or if she should make an appointment. Please advise.

## 2022-08-10 NOTE — Telephone Encounter (Signed)
Ok to send in prednisone taper but please schedule sooner office visit to discuss other treatment options.    Ok to send in prednisone 20 mg tapering by 5 mg every 4 days. Take prednisone in the morning with food and avoid the use of NSAIDs.

## 2022-08-11 NOTE — Progress Notes (Unsigned)
Office Visit Note  Patient: Cheryl Davila             Date of Birth: [birth year]           MRN: [id number]             PCP: Medicine, [facility] Referring: Medicine, [facility]* Visit Date: 08/17/2022 Occupation: @GUAROCC @  Subjective:  Pain in both hands   History of Present Illness: Cheryl Davila is a 27 y.o. female with history of systemic lupus erythematosus.  Patient is taking Plaquenil 200 mg 1 tablet by mouth twice daily.   Lab work from 06/27/22 was reviewed today in the office: RF 23, anti-CCP negative, ESR WNL, CRP WNL, dsDNA negative, complements WNL, and lupus anticoagulant negative.   Activities of Daily Living:  Patient reports morning stiffness for *** {minute/hour:19697}.   Patient {ACTIONS;DENIES/REPORTS:21021675::"Denies"} nocturnal pain.  Difficulty dressing/grooming: {ACTIONS;DENIES/REPORTS:21021675::"Denies"} Difficulty climbing stairs: {ACTIONS;DENIES/REPORTS:21021675::"Denies"} Difficulty getting out of chair: {ACTIONS;DENIES/REPORTS:21021675::"Denies"} Difficulty using hands for taps, buttons, cutlery, and/or writing: {ACTIONS;DENIES/REPORTS:21021675::"Denies"}  No Rheumatology ROS completed.   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    Family History  Problem Relation Age of Onset   Depression Mother    Healthy Sister    Healthy Sister    Healthy Brother    Healthy Brother    Healthy Brother    Hypertension Maternal Grandmother     Diabetes Maternal Grandmother    Congenital heart disease Daughter        pacemaker   Healthy Son    Asthma Neg Hx    Cancer Neg Hx    Stroke Neg Hx    Past Surgical History:  Procedure Laterality Date   CESAREAN SECTION  2021   WISDOM TOOTH EXTRACTION Bilateral    Social History   Social History Narrative   Not on file   Immunization History  Administered Date(s) Administered   Tdap 08/29/2019, 10/20/2021     Objective: Vital Signs: There were no vitals taken for this visit.   Physical Exam Vitals and nursing note reviewed.  Constitutional:      Appearance: She is well-developed.  HENT:     Head: Normocephalic and atraumatic.  Eyes:     Conjunctiva/sclera: Conjunctivae normal.  Cardiovascular:     Rate and Rhythm: Normal rate and regular rhythm.     Heart sounds: Normal heart sounds.  Pulmonary:     Effort: Pulmonary effort is normal.     Breath sounds: Normal breath sounds.  Abdominal:     General: Bowel sounds are normal.     Palpations: Abdomen is soft.  Musculoskeletal:     Cervical back: Normal range of motion.  Lymphadenopathy:     Cervical: No cervical adenopathy.  Skin:    General: Skin is warm and dry.     Capillary Refill: Capillary refill takes less than 2 seconds.  Neurological:     Mental Status: She is alert and oriented to person, place, and time.  Psychiatric:  Behavior: Behavior normal.      Musculoskeletal Exam: ***  CDAI Exam: CDAI Score: -- Patient Global: --; Provider Global: -- Swollen: --; Tender: -- Joint Exam 08/17/2022   No joint exam has been documented for this visit   There is currently no information documented on the homunculus. Go to the Rheumatology activity and complete the homunculus joint exam.  Investigation: No additional findings.  Imaging: No results found.  Recent Labs: Lab Results  Component Value Date   WBC 8.8 06/27/2022   HGB 10.0 (L) 06/27/2022   PLT 356 06/27/2022   NA 137  06/27/2022   K 4.1 06/27/2022   CL 103 06/27/2022   CO2 25 06/27/2022   GLUCOSE 121 (H) 06/27/2022   BUN 9 06/27/2022   CREATININE 0.76 06/27/2022   BILITOT 0.4 06/27/2022   AST 17 06/27/2022   ALT 18 06/27/2022   PROT 8.2 (H) 06/27/2022   CALCIUM 9.8 06/27/2022    Speciality Comments: PLQ Eye Exam: 09/27/2021 WNL @ [facility] Follow up in 1 year  Procedures:  No procedures performed Allergies: Patient has no known allergies.   Assessment / Plan:     Visit Diagnoses: No diagnosis found.  Orders: No orders of the defined types were placed in this encounter.  No orders of the defined types were placed in this encounter.   Face-to-face time spent with patient was *** minutes. Greater than 50% of time was spent in counseling and coordination of care.  Follow-Up Instructions: No follow-ups on file.   Ellen Henri, CMA  Note - This record has been created using Animal nutritionist.  Chart creation errors have been sought, but may not always  have been located. Such creation errors do not reflect on  the standard of medical care.

## 2022-08-17 ENCOUNTER — Ambulatory Visit: Payer: Medicaid Other | Attending: Physician Assistant | Admitting: Physician Assistant

## 2022-08-17 ENCOUNTER — Encounter: Payer: Self-pay | Admitting: Physician Assistant

## 2022-08-17 VITALS — BP 110/72 | HR 81 | Resp 15 | Ht 67.0 in | Wt 175.0 lb

## 2022-08-17 DIAGNOSIS — R5383 Other fatigue: Secondary | ICD-10-CM

## 2022-08-17 DIAGNOSIS — M79641 Pain in right hand: Secondary | ICD-10-CM

## 2022-08-17 DIAGNOSIS — Z79899 Other long term (current) drug therapy: Secondary | ICD-10-CM | POA: Diagnosis not present

## 2022-08-17 DIAGNOSIS — O35BXX0 Maternal care for other (suspected) fetal abnormality and damage, fetal cardiac anomalies, not applicable or unspecified: Secondary | ICD-10-CM

## 2022-08-17 DIAGNOSIS — M3219 Other organ or system involvement in systemic lupus erythematosus: Secondary | ICD-10-CM | POA: Diagnosis not present

## 2022-08-17 DIAGNOSIS — R760 Raised antibody titer: Secondary | ICD-10-CM | POA: Diagnosis not present

## 2022-08-17 DIAGNOSIS — M79642 Pain in left hand: Secondary | ICD-10-CM

## 2022-08-17 NOTE — Progress Notes (Signed)
Pharmacy Note  Subjective: Patient presents today to [facility] for follow up office visit. Patient seen by the pharmacist for counseling on azathioprine (Imuran) for systemic lupus erythematosus.  Previous therapy includes: hydroxychloroquine 200 mg tablet twice daily.  Objective: CMP     Component Value Date/Time   NA 137 06/27/2022 1551   K 4.1 06/27/2022 1551   CL 103 06/27/2022 1551   CO2 25 06/27/2022 1551   GLUCOSE 121 (H) 06/27/2022 1551   BUN 9 06/27/2022 1551   CREATININE 0.76 06/27/2022 1551   CALCIUM 9.8 06/27/2022 1551   PROT 8.2 (H) 06/27/2022 1551   AST 17 06/27/2022 1551   ALT 18 06/27/2022 1551   BILITOT 0.4 06/27/2022 1551    CBC    Component Value Date/Time   WBC 8.8 06/27/2022 1551   RBC 4.83 06/27/2022 1551   HGB 10.0 (L) 06/27/2022 1551   HGB 10.3 (L) 10/20/2021 0907   HCT 33.7 (L) 06/27/2022 1551   HCT 31.9 (L) 10/20/2021 0907   PLT 356 06/27/2022 1551   PLT 249 10/20/2021 0907   MCV 69.8 (L) 06/27/2022 1551   MCV 76 (L) 10/20/2021 0907   MCH 20.7 (L) 06/27/2022 1551   MCHC 29.7 (L) 06/27/2022 1551   RDW 17.8 (H) 06/27/2022 1551   RDW 17.5 (H) 10/20/2021 0907   LYMPHSABS 2,482 06/27/2022 1551   LYMPHSABS 2.2 08/08/2021 1445   EOSABS 26 06/27/2022 1551   EOSABS 0.1 08/08/2021 1445   BASOSABS 70 06/27/2022 1551   BASOSABS 0.1 08/08/2021 1445    Baseline Immunosuppressant Therapy Labs TB GOLD   Hepatitis Panel    Latest Ref Rng & Units 08/08/2021    2:45 PM  Hepatitis  Hep B Surface Ag Negative Negative    HIV Lab Results  Component Value Date   HIV Non Reactive 10/20/2021   HIV Non Reactive 08/08/2021   HIV Non Reactive 08/15/2019   HIV Non Reactive 04/16/2019   Immunoglobulins   SPEP    Latest Ref Rng & Units 06/27/2022    3:51 PM  Serum Protein Electrophoresis  Total Protein 6.1 - 8.1 g/dL 8.2    W2NF Lab Results  Component Value Date   G6PDH 22.4 (H) 04/29/2021   TPMT:  pending  Assessment/Plan: Patient was counseled on the purpose, proper use, and adverse effects of azathioprine including risk of infection, nausea, rash, and hair loss. Also informed that medication can cause discoloration of urine, sweat and tears. Reviewed risk of cancer after long term use.  Discussed risk of myelosupression and reviewed importance of frequent lab work to monitor blood counts. Standing orders placed. Reviewed drug-drug interactions including contraindication with allopurinol. Provided patient with educational materials on azathioprine and answered all questions. Patient consented to azathioprine.Will upload consent into the media tab.   Patient dose will be 50 mg daily. Prescription pending labs results and/or insurance approval.   Darolyn Rua, PharmD Student [employer]

## 2022-08-27 LAB — CBC WITH DIFFERENTIAL/PLATELET
Absolute Monocytes: 766 cells/uL (ref 200–950)
Basophils Absolute: 89 cells/uL (ref 0–200)
Basophils Relative: 0.8 %
Eosinophils Absolute: 111 cells/uL (ref 15–500)
Eosinophils Relative: 1 %
HCT: 33.7 % — ABNORMAL LOW (ref 35.0–45.0)
Hemoglobin: 9.7 g/dL — ABNORMAL LOW (ref 11.7–15.5)
Lymphs Abs: 5273 cells/uL — ABNORMAL HIGH (ref 850–3900)
MCH: 21 pg — ABNORMAL LOW (ref 27.0–33.0)
MCHC: 28.8 g/dL — ABNORMAL LOW (ref 32.0–36.0)
MCV: 72.8 fL — ABNORMAL LOW (ref 80.0–100.0)
MPV: 12.9 fL — ABNORMAL HIGH (ref 7.5–12.5)
Monocytes Relative: 6.9 %
Neutro Abs: 4862 cells/uL (ref 1500–7800)
Neutrophils Relative %: 43.8 %
Platelets: 347 10*3/uL (ref 140–400)
RBC: 4.63 10*6/uL (ref 3.80–5.10)
RDW: 17.4 % — ABNORMAL HIGH (ref 11.0–15.0)
Total Lymphocyte: 47.5 %
WBC: 11.1 10*3/uL — ABNORMAL HIGH (ref 3.8–10.8)

## 2022-08-27 LAB — IGG, IGA, IGM
IgG (Immunoglobin G), Serum: 1757 mg/dL — ABNORMAL HIGH (ref 600–1640)
IgM, Serum: 202 mg/dL (ref 50–300)
Immunoglobulin A: 300 mg/dL (ref 47–310)

## 2022-08-27 LAB — COMPLETE METABOLIC PANEL WITH GFR
AG Ratio: 1.3 (calc) (ref 1.0–2.5)
ALT: 13 U/L (ref 6–29)
AST: 12 U/L (ref 10–30)
Albumin: 4.4 g/dL (ref 3.6–5.1)
Alkaline phosphatase (APISO): 80 U/L (ref 31–125)
BUN: 13 mg/dL (ref 7–25)
CO2: 26 mmol/L (ref 20–32)
Calcium: 9.4 mg/dL (ref 8.6–10.2)
Chloride: 104 mmol/L (ref 98–110)
Creat: 0.7 mg/dL (ref 0.50–0.96)
Globulin: 3.4 g/dL (calc) (ref 1.9–3.7)
Glucose, Bld: 70 mg/dL (ref 65–99)
Potassium: 3.8 mmol/L (ref 3.5–5.3)
Sodium: 139 mmol/L (ref 135–146)
Total Bilirubin: 0.3 mg/dL (ref 0.2–1.2)
Total Protein: 7.8 g/dL (ref 6.1–8.1)
eGFR: 123 mL/min/{1.73_m2} (ref >=60)

## 2022-08-27 LAB — PROTEIN ELECTROPHORESIS, SERUM, WITH REFLEX
Albumin ELP: 4.1 g/dL (ref 3.8–4.8)
Alpha 1: 0.3 g/dL (ref 0.2–0.3)
Alpha 2: 0.6 g/dL (ref 0.5–0.9)
Beta 2: 0.4 g/dL (ref 0.2–0.5)
Beta Globulin: 0.6 g/dL (ref 0.4–0.6)
Gamma Globulin: 1.6 g/dL (ref 0.8–1.7)
Total Protein: 7.6 g/dL (ref 6.1–8.1)

## 2022-08-27 LAB — HEPATITIS B CORE ANTIBODY, IGM: Hep B C IgM: NONREACTIVE

## 2022-08-27 LAB — QUANTIFERON-TB GOLD PLUS
Mitogen-NIL: >10 IU/mL
NIL: 0.02 IU/mL
QuantiFERON-TB Gold Plus: NEGATIVE
TB1-NIL: 0 IU/mL
TB2-NIL: 0 IU/mL

## 2022-08-27 LAB — HEPATITIS C ANTIBODY: Hepatitis C Ab: NONREACTIVE

## 2022-08-27 LAB — THIOPURINE METHYLTRANSFERASE (TPMT), RBC: Thiopurine Methyltransferase, RBC: 18 nmol/hr/mL RBC

## 2022-08-27 LAB — HEPATITIS B SURFACE ANTIGEN: Hepatitis B Surface Ag: NONREACTIVE

## 2022-08-27 NOTE — Progress Notes (Signed)
TPMT WNL.  Patient remains anemic and hemoglobin is gradually trending down.  Please notify the patient.  Please clarify if she has been taking an iron supplement? Prenatal vitamins?  IgG is borderline elevated. Rest of immunoglobulins WNL.  SPEP no abnormal protein bands.  CMP WNL  TB gold negative. Hepatitis B and C negative

## 2022-09-13 NOTE — Progress Notes (Deleted)
Office Visit Note  Patient: Cheryl Davila             Date of Birth: 20-Dec-1995           MRN: [id number]             PCP: Medicine, [facility] Referring: Medicine, [facility]* Visit Date: 09/27/2022 Occupation: @GUAROCC @  Subjective:  Medication monitoring-imuran   History of Present Illness: Cheryl Davila is a 27 y.o. female with history of systemic lupus erythematosus.  Patient remains on Imuran 50 mg 1 tablet by mouth daily and plaquenil 200 mg 1 tablet by mouth twice daily- started in early April 2023.  Lab work from 06/27/22 was reviewed today in the office: RF 23, anti-CCP negative, ESR WNL, CRP WNL, dsDNA negative, complements WNL, and lupus anticoagulant negative. The following lab work will be updated today.    CBC and CMP updated on 08/17/22. Orders for CBC and CMP released today.  PLQ Eye Exam: 09/27/2021 WNL @ [facility]-due to update plaquenil eye exam.    Activities of Daily Living:  Patient reports morning stiffness for *** {minute/hour:19697}.   Patient {ACTIONS;DENIES/REPORTS:21021675::"Denies"} nocturnal pain.  Difficulty dressing/grooming: {ACTIONS;DENIES/REPORTS:21021675::"Denies"} Difficulty climbing stairs: {ACTIONS;DENIES/REPORTS:21021675::"Denies"} Difficulty getting out of chair: {ACTIONS;DENIES/REPORTS:21021675::"Denies"} Difficulty using hands for taps, buttons, cutlery, and/or writing: {ACTIONS;DENIES/REPORTS:21021675::"Denies"}  No Rheumatology ROS completed.   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    Family History  Problem Relation Age of Onset   Depression Mother    Healthy Sister    Healthy Sister    Healthy Brother    Healthy Brother    Healthy Brother    Hypertension Maternal Grandmother    Diabetes Maternal Grandmother    Congenital heart disease Daughter        pacemaker   Healthy Son    Asthma Neg Hx    Cancer Neg Hx    Stroke Neg Hx    Past Surgical History:  Procedure Laterality Date   CESAREAN SECTION  2021   WISDOM TOOTH EXTRACTION Bilateral    Social History   Social History Narrative   Not on file   Immunization History  Administered Date(s) Administered   Tdap 08/29/2019, 10/20/2021     Objective: Vital Signs: There were no vitals taken for this visit.   Physical Exam Vitals and nursing note reviewed.  Constitutional:      Appearance: She is well-developed.  HENT:     Head: Normocephalic and atraumatic.  Eyes:     Conjunctiva/sclera: Conjunctivae normal.  Cardiovascular:     Rate and Rhythm: Normal rate and regular rhythm.     Heart sounds: Normal heart sounds.  Pulmonary:     Effort: Pulmonary effort is normal.     Breath sounds: Normal breath sounds.  Abdominal:     General: Bowel sounds are normal.     Palpations: Abdomen is soft.  Musculoskeletal:     Cervical back: Normal range of motion.  Lymphadenopathy:     Cervical: No  cervical adenopathy.  Skin:    General: Skin is warm and dry.     Capillary Refill: Capillary refill takes less than 2 seconds.  Neurological:     Mental Status: She is alert and oriented to person, place, and time.  Psychiatric:        Behavior: Behavior normal.      Musculoskeletal Exam: ***  CDAI Exam: CDAI Score: -- Patient Global: --; Provider Global: -- Swollen: --; Tender: -- Joint Exam 09/27/2022   No joint exam has been documented for this visit   There is currently no information documented on  the homunculus. Go to the Rheumatology activity and complete the homunculus joint exam.  Investigation: No additional findings.  Imaging: No results found.  Recent Labs: Lab Results  Component Value Date   WBC 11.1 (H) 08/17/2022   HGB 9.7 (L) 08/17/2022   PLT 347 08/17/2022   NA 139 08/17/2022   K 3.8 08/17/2022   CL 104 08/17/2022   CO2 26 08/17/2022   GLUCOSE 70 08/17/2022   BUN 13 08/17/2022   CREATININE 0.70 08/17/2022   BILITOT 0.3 08/17/2022   AST 12 08/17/2022   ALT 13 08/17/2022   PROT 7.8 08/17/2022   PROT 7.6 08/17/2022   CALCIUM 9.4 08/17/2022   QFTBGOLDPLUS NEGATIVE 08/17/2022    Speciality Comments: PLQ Eye Exam: 09/27/2021 WNL @ [facility] Follow up in 1 year  Procedures:  No procedures performed Allergies: Patient has no known allergies.   Assessment / Plan:     Visit Diagnoses: Other systemic lupus erythematosus with other organ involvement (HCC)  Lupus anticoagulant positive  High risk medication use  Pain in both hands  Other fatigue  Heart block, fetal, affecting care of mother  Orders: No orders of the defined types were placed in this encounter.  No orders of the defined types were placed in this encounter.   Face-to-face time spent with patient was *** minutes. Greater than 50% of time was spent in counseling and coordination of care.  Follow-Up Instructions: No follow-ups on file.   Gearldine Bienenstock, PA-C  Note - This record has been created using Dragon software.  Chart creation errors have been sought, but may not always  have been located. Such creation errors do not reflect on  the standard of medical care.

## 2022-09-16 ENCOUNTER — Other Ambulatory Visit: Payer: Self-pay | Admitting: Medical

## 2022-09-16 ENCOUNTER — Other Ambulatory Visit: Payer: Self-pay | Admitting: Physician Assistant

## 2022-09-16 DIAGNOSIS — M329 Systemic lupus erythematosus, unspecified: Secondary | ICD-10-CM

## 2022-09-18 NOTE — Telephone Encounter (Signed)
Last Fill: 06/12/2022  Eye exam: 09/27/2021 WNL   Labs: 08/17/2022 CMP WNL Patient remains anemic and hemoglobin is gradually trending down   Next Visit: 09/27/2022  Last Visit: 08/17/2022  DX:Other systemic lupus erythematosus with other organ involvement   Current Dose per office note 08/17/2022: Plaquenil 200 mg 1 tablet by mouth twice daily   Okay to refill Plaquenil?

## 2022-09-27 ENCOUNTER — Ambulatory Visit: Payer: Medicaid Other | Admitting: Physician Assistant

## 2022-09-27 DIAGNOSIS — Z79899 Other long term (current) drug therapy: Secondary | ICD-10-CM

## 2022-09-27 DIAGNOSIS — M79642 Pain in left hand: Secondary | ICD-10-CM

## 2022-09-27 DIAGNOSIS — M3219 Other organ or system involvement in systemic lupus erythematosus: Secondary | ICD-10-CM

## 2022-09-27 DIAGNOSIS — R760 Raised antibody titer: Secondary | ICD-10-CM

## 2022-09-27 DIAGNOSIS — O35BXX0 Maternal care for other (suspected) fetal abnormality and damage, fetal cardiac anomalies, not applicable or unspecified: Secondary | ICD-10-CM

## 2022-09-27 DIAGNOSIS — R5383 Other fatigue: Secondary | ICD-10-CM

## 2022-12-02 ENCOUNTER — Other Ambulatory Visit: Payer: Self-pay | Admitting: Obstetrics and Gynecology

## 2022-12-02 DIAGNOSIS — Z30011 Encounter for initial prescription of contraceptive pills: Secondary | ICD-10-CM

## 2023-01-15 ENCOUNTER — Other Ambulatory Visit: Payer: Self-pay | Admitting: Physician Assistant

## 2023-01-16 NOTE — Telephone Encounter (Signed)
Please schedule patient a follow up visit. Patient due August 2024. Thanks!   Follow-Up Instructions: Return in about 2 months (around 10/17/2022) for Systemic lupus erythematosus.

## 2023-01-16 NOTE — Telephone Encounter (Signed)
Last Fill: 09/18/2022   Eye exam: 10/04/2022 WNL   Labs: 08/17/2022 CMP WNL Patient remains anemic and hemoglobin is gradually trending down    Next Visit: Due August 2024. Message sent to the front to schedule.    Last Visit: 08/17/2022   DX:Other systemic lupus erythematosus with other organ involvement    Current Dose per office note 08/17/2022: Plaquenil 200 mg 1 tablet by mouth twice daily    Okay to refill Plaquenil?

## 2023-01-24 NOTE — Progress Notes (Signed)
Office Visit Note  Patient: Cheryl Davila             Date of Birth: 1995/11/15           MRN: [id number]             PCP: Medicine, [facility] Referring: Medicine, [facility]* Visit Date: 02/07/2023 Occupation: @GUAROCC @  Subjective:  Pain in both hands  History of Present Illness: Cheryl Davila is a 27 y.o. female with systemic lupus with the doses.  She returns today after her last visit in June 2024.  She states she continues to have some pain and stiffness in her hands but she has not noticed any visible swelling.  None of the other joints are painful.  She continues to take hydroxychloroquine 200 mg p.o. twice daily.  She denies any interruption in the treatment.  She states she did not pick up the prescription of Imuran as she forgot.  She has not had any recent prednisone taper.  Patient states that she has been more forgetful recently.  She also has increased workload as her husband travels for work.  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    Activities of Daily Living:  Patient reports morning stiffness for 0  minute.   Patient Denies nocturnal pain.  Difficulty dressing/grooming: Denies Difficulty climbing stairs: Denies Difficulty getting out of chair: Denies Difficulty using hands for taps, buttons, cutlery, and/or writing: Denies  Review of Systems  Constitutional:  Negative for fatigue.  HENT:  Negative for mouth sores and mouth dryness.   Eyes:  Negative for dryness.  Respiratory:  Negative for cough and difficulty breathing.   Cardiovascular:  Negative for chest pain and palpitations.  Gastrointestinal:  Negative for blood in stool, constipation and diarrhea.  Endocrine: Negative for increased urination.  Genitourinary:  Negative for decreased urine output.  Musculoskeletal:  Positive for joint pain and joint pain. Negative for joint swelling, myalgias, morning stiffness and myalgias.  Skin:  Negative for color  change, rash and sensitivity to sunlight.  Allergic/Immunologic: Negative for susceptible to infections.  Neurological:  Positive for memory loss. Negative for dizziness and headaches.  Hematological:  Negative for swollen glands.  Psychiatric/Behavioral:  Negative for depressed mood and sleep disturbance. The patient is not nervous/anxious.     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    Family History  Problem Relation Age of Onset   Depression Mother    Healthy Sister    Healthy Sister    Healthy Brother    Healthy Brother    Healthy Brother    Hypertension Maternal Grandmother    Diabetes Maternal Grandmother    Congenital heart disease Daughter        pacemaker   Healthy Son    Asthma Neg Hx    Cancer Neg Hx    Stroke Neg Hx    Past Surgical History:  Procedure Laterality Date   CESAREAN SECTION  2021  WISDOM TOOTH EXTRACTION Bilateral    Social History   Social History Narrative   Not on file   Immunization History  Administered Date(s) Administered   Tdap 08/29/2019, 10/20/2021     Objective: Vital Signs: BP 115/78 (BP Location: Left Arm, Patient Position: Sitting, Cuff Size: Normal)   Pulse 73   Resp 15   Ht 5\' 7"  (1.702 m)   Wt 167 lb (75.8 kg)   BMI 26.16 kg/m    Physical Exam Vitals and nursing note reviewed.  Constitutional:      Appearance: She is well-developed.  HENT:     Head: Normocephalic and atraumatic.  Eyes:     Conjunctiva/sclera:  Conjunctivae normal.  Cardiovascular:     Rate and Rhythm: Normal rate and regular rhythm.     Heart sounds: Normal heart sounds.  Pulmonary:     Effort: Pulmonary effort is normal.     Breath sounds: Normal breath sounds.  Abdominal:     General: Bowel sounds are normal.     Palpations: Abdomen is soft.  Musculoskeletal:     Cervical back: Normal range of motion.  Lymphadenopathy:     Cervical: No cervical adenopathy.  Skin:    General: Skin is warm and dry.     Capillary Refill: Capillary refill takes less than 2 seconds.  Neurological:     Mental Status: She is alert and oriented to person, place, and time.  Psychiatric:        Behavior: Behavior normal.      Musculoskeletal Exam: Cervical, thoracic and lumbar spine were in good range of motion.  Shoulder joints, elbow joints, wrist joints, MCPs PIPs and DIPs Juengel range of motion with no synovitis.  Hip joints and knee joints with good range of motion without any warmth swelling or effusion.  There was no tenderness over ankles or MTPs.  CDAI Exam: CDAI Score: -- Patient Global: --; Provider Global: -- Swollen: --; Tender: -- Joint Exam 02/07/2023   No joint exam has been documented for this visit   There is currently no information documented on the homunculus. Go to the Rheumatology activity and complete the homunculus joint exam.  Investigation: No additional findings.  Imaging: No results found.  Recent Labs: Lab Results  Component Value Date   WBC 11.1 (H) 08/17/2022   HGB 9.7 (L) 08/17/2022   PLT 347 08/17/2022   NA 139 08/17/2022   K 3.8 08/17/2022   CL 104 08/17/2022   CO2 26 08/17/2022   GLUCOSE 70 08/17/2022   BUN 13 08/17/2022   CREATININE 0.70 08/17/2022   BILITOT 0.3 08/17/2022   AST 12 08/17/2022   ALT 13 08/17/2022   PROT 7.8 08/17/2022   PROT 7.6 08/17/2022   CALCIUM 9.4 08/17/2022   QFTBGOLDPLUS NEGATIVE 08/17/2022    Speciality Comments: PLQ Eye Exam: 10/04/2022 WNL @ [facility] Follow up in 1 year  Procedures:  No procedures performed Allergies: Patient has no known allergies.   Assessment / Plan:     Visit Diagnoses: Other systemic lupus erythematosus with other organ involvement (HCC) - Positive ANA, positive SSA, positive lupus anticoagulant, history of fatigue, joint pain, chest tightness, fetal congenital heart block 2021: Patient states she continues to have some fatigue and joint pain.  She notices joint discomfort mostly in her hands.  She has not noticed any joint swelling.  She states she has been under a lot of stress as she has been working full-time and also taking care of her  kids.  Her husband travels for her job.  None of the other joints are painful.  She denies any interruption in hydroxychloroquine which she has been taking 200 mg p.o. twice daily since April 2023.  She was given a prednisone taper at her last visit in June 2024.  She has had no flares.  At the last visit killer Mitzi Davenport also discussed possible use of Imuran and with the patient.  Patient states that she did not pick up the prescription of Imuran as she totally forgot about it.  She continues to have some discomfort in her hands off-and-on but she has not noticed any morning stiffness or joint swelling.  She had no synovitis on the examination today.  Labs have been acquired.  We decided to observe her symptoms for now.  I also offered her an ultrasound of bilateral hands to evaluate for synovitis if she had ongoing discomfort in her hands.  Patient would like to hold off ultrasound for now.  Labs on Jun 27, 2022 showed normal complements and normal sed rate.  Double-stranded DNA was negative.  Rheumatoid factor was low titer positive.  Will check labs today and notify her of the results when available.  I also advised her to contact us if she notices any increased joint pain or swelling.  Lupus anticoagulant positive - Lupus anticoagulant not detected on 06/27/2022.  Repeat  lupus anticoagulant has been negative.  High risk medication use - Plaquenil 200 mg 1 tablet by mouth twice daily- started in early April 2023,PLQ Eye Exam: 10/04/2022.  Labs from August 17, 2022 were reviewed.  White cell count was mildly elevated most likely due to prednisone use.  She was also anemic with hemoglobin of 9.7.  Pain in both hands -she continues to have some discomfort in her hands.  No synovitis was noted.  Patient declined ultrasound for now.  X-rays of both hands were unremarkable on 06/27/2022.  A handout on hand exercises was given.  Other fatigue-patient has not noticed any increased fatigue.  She states she has been very busy between her work and home.  She states she has been forgetting things easily but she relates it to having a lot of responsibilities.  Heart block, fetal, affecting care of mother - Her daughter has a pacemaker.  She established care with maternal-fetal medicine on 09/01/2021.  Orders: Orders Placed This Encounter  Procedures   Protein / creatinine ratio, urine   CBC with Differential/Platelet   COMPLETE METABOLIC PANEL WITH GFR   Anti-DNA antibody, double-stranded   C3 and C4   Sedimentation rate   No orders of the defined types were placed in this encounter.    Follow-Up Instructions: Return in about 5 months (around 07/08/2023) for Systemic lupus.   Pollyann Savoy, MD  Note - This record has been created using Animal nutritionist.  Chart creation errors have been sought, but may not always  have been located. Such creation errors do not reflect on  the standard of medical care.

## 2023-02-07 ENCOUNTER — Encounter: Payer: Self-pay | Admitting: Rheumatology

## 2023-02-07 ENCOUNTER — Ambulatory Visit: Payer: Medicaid Other | Attending: Rheumatology | Admitting: Rheumatology

## 2023-02-07 VITALS — BP 115/78 | HR 73 | Resp 15 | Ht 67.0 in | Wt 167.0 lb

## 2023-02-07 DIAGNOSIS — R760 Raised antibody titer: Secondary | ICD-10-CM

## 2023-02-07 DIAGNOSIS — M79641 Pain in right hand: Secondary | ICD-10-CM

## 2023-02-07 DIAGNOSIS — M3219 Other organ or system involvement in systemic lupus erythematosus: Secondary | ICD-10-CM | POA: Diagnosis not present

## 2023-02-07 DIAGNOSIS — R5383 Other fatigue: Secondary | ICD-10-CM

## 2023-02-07 DIAGNOSIS — Z79899 Other long term (current) drug therapy: Secondary | ICD-10-CM | POA: Diagnosis not present

## 2023-02-07 DIAGNOSIS — M79642 Pain in left hand: Secondary | ICD-10-CM

## 2023-02-07 DIAGNOSIS — O35BXX0 Maternal care for other (suspected) fetal abnormality and damage, fetal cardiac anomalies, not applicable or unspecified: Secondary | ICD-10-CM

## 2023-02-07 NOTE — Patient Instructions (Signed)
Hand Exercises Hand exercises can be helpful for almost anyone. They can strengthen your hands and improve flexibility and movement. The exercises can also increase blood flow to the hands. These results can make your work and daily tasks easier for you. Hand exercises can be especially helpful for people who have joint pain from arthritis or nerve damage from using their hands over and over. These exercises can also help people who injure a hand. Exercises Most of these hand exercises are gentle stretching and motion exercises. It is usually safe to do them often throughout the day. Warming up your hands before exercise may help reduce stiffness. You can do this with gentle massage or by placing your hands in warm water for 10-15 minutes. It is normal to feel some stretching, pulling, tightness, or mild discomfort when you begin new exercises. In time, this will improve. Remember to always be careful and stop right away if you feel sudden, very bad pain or your pain gets worse. You want to get better and be safe. Ask your health care provider which exercises are safe for you. Do exercises exactly as told by your provider and adjust them as told. Do not begin these exercises until told by your provider. Knuckle bend or "claw" fist  Stand or sit with your arm, hand, and all five fingers pointed straight up. Make sure to keep your wrist straight. Gently bend your fingers down toward your palm until the tips of your fingers are touching your palm. Keep your big knuckle straight and only bend the small knuckles in your fingers. Hold this position for 10 seconds. Straighten your fingers back to your starting position. Repeat this exercise 5-10 times with each hand. Full finger fist  Stand or sit with your arm, hand, and all five fingers pointed straight up. Make sure to keep your wrist straight. Gently bend your fingers into your palm until the tips of your fingers are touching the middle of your  palm. Hold this position for 10 seconds. Extend your fingers back to your starting position, stretching every joint fully. Repeat this exercise 5-10 times with each hand. Straight fist  Stand or sit with your arm, hand, and all five fingers pointed straight up. Make sure to keep your wrist straight. Gently bend your fingers at the big knuckle, where your fingers meet your hand, and at the middle knuckle. Keep the knuckle at the tips of your fingers straight and try to touch the bottom of your palm. Hold this position for 10 seconds. Extend your fingers back to your starting position, stretching every joint fully. Repeat this exercise 5-10 times with each hand. Tabletop  Stand or sit with your arm, hand, and all five fingers pointed straight up. Make sure to keep your wrist straight. Gently bend your fingers at the big knuckle, where your fingers meet your hand, as far down as you can. Keep the small knuckles in your fingers straight. Think of forming a tabletop with your fingers. Hold this position for 10 seconds. Extend your fingers back to your starting position, stretching every joint fully. Repeat this exercise 5-10 times with each hand. Finger spread  Place your hand flat on a table with your palm facing down. Make sure your wrist stays straight. Spread your fingers and thumb apart from each other as far as you can until you feel a gentle stretch. Hold this position for 10 seconds. Bring your fingers and thumb tight together again. Hold this position for 10 seconds. Repeat  this exercise 5-10 times with each hand. Making circles  Stand or sit with your arm, hand, and all five fingers pointed straight up. Make sure to keep your wrist straight. Make a circle by touching the tip of your thumb to the tip of your index finger. Hold for 10 seconds. Then open your hand wide. Repeat this motion with your thumb and each of your fingers. Repeat this exercise 5-10 times with each hand. Thumb  motion  Sit with your forearm resting on a table and your wrist straight. Your thumb should be facing up toward the ceiling. Keep your fingers relaxed as you move your thumb. Lift your thumb up as high as you can toward the ceiling. Hold for 10 seconds. Bend your thumb across your palm as far as you can, reaching the tip of your thumb for the small finger (pinkie) side of your palm. Hold for 10 seconds. Repeat this exercise 5-10 times with each hand. Grip strengthening  Hold a stress ball or other soft ball in the middle of your hand. Slowly increase the pressure, squeezing the ball as much as you can without causing pain. Think of bringing the tips of your fingers into the middle of your palm. All of your finger joints should bend when doing this exercise. Hold your squeeze for 10 seconds, then relax. Repeat this exercise 5-10 times with each hand. Contact a health care provider if: Your hand pain or discomfort gets much worse when you do an exercise. Your hand pain or discomfort does not improve within 2 hours after you exercise. If you have either of these problems, stop doing these exercises right away. Do not do them again unless your provider says that you can. Get help right away if: You develop sudden, severe hand pain or swelling. If this happens, stop doing these exercises right away. Do not do them again unless your provider says that you can. This information is not intended to replace advice given to you by your health care provider. Make sure you discuss any questions you have with your health care provider. Document Revised: 02/21/2022 Document Reviewed: 02/21/2022 Elsevier Patient Education  2024 [employer]    Vaccines You are taking a medication(s) that can suppress your immune system.  The following immunizations are recommended: Flu annually Covid-19  Td/Tdap (tetanus, diphtheria, pertussis) every 10 years Pneumonia (Prevnar 15 then Pneumovax 23 at least 1 year  apart.  Alternatively, can take Prevnar 20 without needing additional dose) Shingrix: 2 doses from 4 weeks to 6 months apart  Please check with your PCP to make sure you are up to date.

## 2023-02-08 LAB — COMPLETE METABOLIC PANEL WITH GFR
AG Ratio: 1.4 (calc) (ref 1.0–2.5)
ALT: 13 U/L (ref 6–29)
AST: 12 U/L (ref 10–30)
Albumin: 4.6 g/dL (ref 3.6–5.1)
Alkaline phosphatase (APISO): 73 U/L (ref 31–125)
BUN: 13 mg/dL (ref 7–25)
CO2: 24 mmol/L (ref 20–32)
Calcium: 9.6 mg/dL (ref 8.6–10.2)
Chloride: 106 mmol/L (ref 98–110)
Creat: 0.67 mg/dL (ref 0.50–0.96)
Globulin: 3.4 g/dL (ref 1.9–3.7)
Glucose, Bld: 71 mg/dL (ref 65–99)
Potassium: 4 mmol/L (ref 3.5–5.3)
Sodium: 136 mmol/L (ref 135–146)
Total Bilirubin: 0.4 mg/dL (ref 0.2–1.2)
Total Protein: 8 g/dL (ref 6.1–8.1)
eGFR: 124 mL/min/{1.73_m2} (ref >=60)

## 2023-02-08 LAB — CBC WITH DIFFERENTIAL/PLATELET
Absolute Lymphocytes: 2512 {cells}/uL (ref 850–3900)
Absolute Monocytes: 647 {cells}/uL (ref 200–950)
Basophils Absolute: 70 {cells}/uL (ref 0–200)
Basophils Relative: 0.9 %
Eosinophils Absolute: 70 {cells}/uL (ref 15–500)
Eosinophils Relative: 0.9 %
HCT: 34.5 % — ABNORMAL LOW (ref 35.0–45.0)
Hemoglobin: 10.1 g/dL — ABNORMAL LOW (ref 11.7–15.5)
MCH: 20.8 pg — ABNORMAL LOW (ref 27.0–33.0)
MCHC: 29.3 g/dL — ABNORMAL LOW (ref 32.0–36.0)
MCV: 71.1 fL — ABNORMAL LOW (ref 80.0–100.0)
Monocytes Relative: 8.3 %
Neutro Abs: 4501 {cells}/uL (ref 1500–7800)
Neutrophils Relative %: 57.7 %
Platelets: 315 10*3/uL (ref 140–400)
RBC: 4.85 10*6/uL (ref 3.80–5.10)
RDW: 16.6 % — ABNORMAL HIGH (ref 11.0–15.0)
Total Lymphocyte: 32.2 %
WBC: 7.8 10*3/uL (ref 3.8–10.8)

## 2023-02-08 LAB — PROTEIN / CREATININE RATIO, URINE
Creatinine, Urine: 144 mg/dL (ref 20–275)
Protein/Creat Ratio: 90 mg/g{creat} (ref 24–184)
Protein/Creatinine Ratio: 0.09 mg/mg{creat} (ref 0.024–0.184)
Total Protein, Urine: 13 mg/dL (ref 5–24)

## 2023-02-08 LAB — C3 AND C4
C3 Complement: 133 mg/dL (ref 83–193)
C4 Complement: 15 mg/dL (ref 15–57)

## 2023-02-08 LAB — ANTI-DNA ANTIBODY, DOUBLE-STRANDED: ds DNA Ab: 2 [IU]/mL

## 2023-02-08 LAB — SEDIMENTATION RATE: Sed Rate: 14 mm/h (ref 0–20)

## 2023-02-09 NOTE — Progress Notes (Signed)
Hemoglobin is low at 10.1.  CMP is normal.  Urine protein creatinine ratio normal.  Sed rate normal, complements normal, anti-DNA negative.  Please forward results to her PCP.

## 2023-04-16 ENCOUNTER — Other Ambulatory Visit: Payer: Self-pay | Admitting: Physician Assistant

## 2023-04-16 NOTE — Telephone Encounter (Signed)
 Last Fill: 01/16/2023  Eye exam: 10/04/2022   Labs: 02/07/2023 Hemoglobin is low at 10.1.  CMP is normal.  Urine protein creatinine ratio normal.  Sed rate normal, complements normal, anti-DNA negative.   Next Visit: 07/09/2023  Last Visit: 02/07/2023  DX:Other systemic lupus erythematosus with other organ involvement   Current Dose per office note on 02/07/2023: Plaquenil 200 mg 1 tablet by mouth twice daily   Okay to refill Plaquenil?

## 2023-05-28 ENCOUNTER — Telehealth: Payer: Self-pay | Admitting: *Deleted

## 2023-05-28 ENCOUNTER — Encounter: Payer: Self-pay | Admitting: *Deleted

## 2023-05-28 NOTE — Telephone Encounter (Signed)
 Error

## 2023-05-29 NOTE — Telephone Encounter (Signed)
 Please see if the patient can come in for further evaluation and to update labs

## 2023-06-01 NOTE — Progress Notes (Unsigned)
 Office Visit Note  Patient: Cheryl Davila             Date of Birth: 1996-02-06           MRN: [id number]             PCP: [facility], [facility] Referring: Medicine, [facility]* Visit Date: 06/06/2023 Occupation: @GUAROCC @  Subjective:  Flare   History of Present Illness: Calleigh Channing is a 28 y.o. female with history of systemic lupus.  Patient remains on Plaquenil 200 mg 1 tablet by mouth twice daily.  She is tolerating plaquenil without any side effects.  She denies any interruptions in therapy.  Patient remains for the past 2 weeks she has been experiencing increased fatigue and generalized aching and joint stiffness.  Patient states it feels like she is "fighting against weight when moving."  Patient reports that she has been under increased rest recently and has not been sleeping well which she feels is contributing to her current flare.  Patient states that she continues to have symptoms of dry mouth as well as intermittent symptoms of Raynaud's phenomenon.  She denies any recent rashes, oral or nasal ulcerations, or hair loss.  Activities of Daily Living:  Patient reports morning stiffness for several hours.   Patient Reports nocturnal pain.  Difficulty dressing/grooming: Reports Difficulty climbing stairs: Denies Difficulty getting out of chair: Denies Difficulty using hands for taps, buttons, cutlery, and/or writing: Reports  Review of Systems  Constitutional:  Positive for fatigue.  HENT:  Positive for mouth dryness. Negative for mouth sores and nose dryness.   Eyes:  Negative for pain and dryness.  Respiratory:  Negative for shortness of breath and difficulty breathing.   Cardiovascular:  Negative for chest pain and palpitations.  Gastrointestinal:  Negative for blood in stool, constipation and diarrhea.  Endocrine: Positive for excessive thirst. Negative for increased urination.  Genitourinary:  Negative for involuntary urination.  Musculoskeletal:   Positive for joint pain, joint pain, myalgias, morning stiffness and myalgias. Negative for gait problem, joint swelling, muscle weakness and muscle tenderness.  Skin:  Positive for color change. Negative for rash, hair loss and sensitivity to sunlight.  Allergic/Immunologic: Negative for susceptible to infections.  Neurological:  Negative for dizziness, numbness and headaches.  Hematological:  Negative for swollen glands.  Psychiatric/Behavioral:  Negative for depressed mood and sleep disturbance. The patient is not nervous/anxious.     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    Family History  Problem Relation Age of Onset   Depression Mother    Healthy Sister    Healthy Sister    Healthy Brother    Healthy Brother    Healthy Brother    Hypertension Maternal Grandmother    Diabetes Maternal Grandmother    Congenital heart disease Daughter        pacemaker   Healthy Son    Asthma Neg Hx    Cancer Neg Hx    Stroke Neg Hx    Past Surgical  History:  Procedure Laterality Date   CESAREAN SECTION  2021   WISDOM TOOTH EXTRACTION Bilateral    Social History   Social History Narrative   Not on file   Immunization History  Administered Date(s) Administered   Tdap 08/29/2019, 10/20/2021     Objective: Vital Signs: BP 107/73 (BP Location: Left Arm, Patient Position: Sitting, Cuff Size: Normal)   Pulse 69   Resp 15   Ht 5\' 7"  (1.702 m)   Wt 173 lb 3.2 oz (78.6 kg)   BMI 27.13  kg/m    Physical Exam Vitals and nursing note reviewed.  Constitutional:      Appearance: She is well-developed.  HENT:     Head: Normocephalic and atraumatic.  Eyes:     Conjunctiva/sclera: Conjunctivae normal.  Cardiovascular:     Rate and Rhythm: Normal rate and regular rhythm.     Heart sounds: Normal heart sounds.  Pulmonary:     Effort: Pulmonary effort is normal.     Breath sounds: Normal breath sounds.  Abdominal:     General: Bowel sounds are normal.     Palpations: Abdomen is soft.  Musculoskeletal:     Cervical back: Normal range of motion.  Lymphadenopathy:     Cervical: No cervical adenopathy.  Skin:    General: Skin is warm and dry.     Capillary Refill: Capillary refill takes less than 2 seconds.  Neurological:     Mental Status: She is alert and oriented to person, place, and time.  Psychiatric:        Behavior: Behavior normal.      Musculoskeletal Exam: C-spine, thoracic spine, and lumbar spine good ROM.  Shoulder joints, elbow joints, wrist joints, MCPs, PIPs, and DIPs good ROM with no synovitis.  Complete fist formation bilaterally.  Hip joints have good ROM.  Knee joints have good ROM with no warmth or effusion.  Ankle joints have good ROM with no tenderness or swelling.  CDAI Exam: CDAI Score: -- Patient Global: --; Provider Global: -- Swollen: --; Tender: -- Joint Exam 06/06/2023   No joint exam has been documented for this visit   There is currently no information documented on the homunculus. Go to the Rheumatology activity and complete the homunculus joint exam.  Investigation: No additional findings.  Imaging: No results found.  Recent Labs: Lab Results  Component Value Date   WBC 7.8 02/07/2023   HGB 10.1 (L) 02/07/2023   PLT 315 02/07/2023   NA 136 02/07/2023   K 4.0 02/07/2023   CL 106 02/07/2023   CO2 24 02/07/2023   GLUCOSE 71 02/07/2023   BUN 13 02/07/2023   CREATININE 0.67 02/07/2023   BILITOT 0.4 02/07/2023   AST 12  02/07/2023   ALT 13 02/07/2023   PROT 8.0 02/07/2023   CALCIUM 9.6 02/07/2023   QFTBGOLDPLUS NEGATIVE 08/17/2022    Speciality Comments: PLQ Eye Exam: 10/04/2022 WNL @ [facility] Follow up in 1 year  Procedures:  No procedures performed Allergies: Patient has no known allergies.      Assessment / Plan:     Visit Diagnoses: Other systemic lupus erythematosus with other organ involvement (HCC) - Positive ANA, positive SSA, positive lupus anticoagulant, history of fatigue, joint pain, chest tightness, fetal congenital heart block 2021: Patient presents today experiencing a flare for the past 2 weeks.  She has been under increased stress and has not been sleeping well at night which she feels like is contributing to her symptoms.  She  has been experiencing increased fatigue, arthralgias, and joint stiffness.  Chronic  mouth dryness and intermittent symptoms of raynaud's.  No malar rash.  No oral or nasal ulcers.   Lab work from 02/07/23 was reviewed today in the office: dsDNA negative, complements WNL, and ESR WNL.   A prednisone taper starting at 20 mg tapering by 5 mg every 4 days was sent to the pharmacy.  Instructions were provided including taking prednisone first thing in the morning and avoiding the use of NSAIDs.  The following lab work will be obtained today for further evaluation.  She will remain on Plaquenil as prescribed.  Discussed that if she continues to have persistent or recurrent symptoms we may need to discuss combination therapy.  She will follow-up in the office in 3 months or sooner if needed. - Plan: Protein / creatinine ratio, urine, CBC with Differential/Platelet, Comprehensive metabolic panel with GFR, Sedimentation rate, C3 and C4, Anti-DNA antibody, double-stranded, C-reactive protein  Lupus anticoagulant positive - Lupus anticoagulant not detected on 06/27/2022.  Repeat lupus anticoagulant has been negative.  High risk medication use - Plaquenil 200 mg 1  tablet by mouth twice daily- started in early April 2023.   PLQ Eye Exam: 10/04/2022 WNL @ [facility] Follow up in 1 year  Cbc and CMP updated on 02/07/23.  Orders for CBC and CMP updated today.   - Plan: CBC with Differential/Platelet, Comprehensive metabolic panel with GFR  Pain in both hands - X-rays of both hands were unremarkable on 06/27/2022.  She presents today with increased pain and stiffness in both hands and both wrists.  No synovitis was noted.  ESR and CRP updated.   Other fatigue: She has been experiencing increased fatigue for the past 2 weeks.   Heart block, fetal, affecting care of mother - Her daughter has a pacemaker.  Orders: Orders Placed This Encounter  Procedures   Protein / creatinine ratio, urine   CBC with Differential/Platelet   Comprehensive metabolic panel with GFR   Sedimentation rate   C3 and C4   Anti-DNA antibody, double-stranded   C-reactive protein   Meds ordered this encounter  Medications   predniSONE (DELTASONE) 5 MG tablet    Sig: Take 4 tabs po x 4 days, 3  tabs po x 4 days, 2  tabs po x 4 days, 1  tab po x 4 days    Dispense:  40 tablet    Refill:  0      Follow-Up Instructions: Return in about 3 months (around 09/05/2023) for Systemic lupus erythematosus.   Romayne Clubs, PA-C  Note - This record has been created using Dragon software.  Chart creation errors have been sought, but may not always  have been located. Such creation errors do not reflect on  the standard of medical care.

## 2023-06-06 ENCOUNTER — Encounter: Payer: Self-pay | Admitting: Physician Assistant

## 2023-06-06 ENCOUNTER — Ambulatory Visit: Attending: Physician Assistant | Admitting: Physician Assistant

## 2023-06-06 VITALS — BP 107/73 | HR 69 | Resp 15 | Ht 67.0 in | Wt 173.2 lb

## 2023-06-06 DIAGNOSIS — R760 Raised antibody titer: Secondary | ICD-10-CM | POA: Diagnosis not present

## 2023-06-06 DIAGNOSIS — M79641 Pain in right hand: Secondary | ICD-10-CM

## 2023-06-06 DIAGNOSIS — Z79899 Other long term (current) drug therapy: Secondary | ICD-10-CM

## 2023-06-06 DIAGNOSIS — M3219 Other organ or system involvement in systemic lupus erythematosus: Secondary | ICD-10-CM | POA: Diagnosis not present

## 2023-06-06 DIAGNOSIS — O35BXX0 Maternal care for other (suspected) fetal abnormality and damage, fetal cardiac anomalies, not applicable or unspecified: Secondary | ICD-10-CM

## 2023-06-06 DIAGNOSIS — R5383 Other fatigue: Secondary | ICD-10-CM

## 2023-06-06 DIAGNOSIS — M79642 Pain in left hand: Secondary | ICD-10-CM

## 2023-06-06 MED ORDER — PREDNISONE 5 MG PO TABS
ORAL_TABLET | ORAL | 0 refills | Status: AC
Start: 2023-06-06 — End: ?

## 2023-06-07 NOTE — Progress Notes (Signed)
 Patient remains anemic. Please see if iron panel can be added?  Total protein is slightly elevated-8.2. rest of CMP WNL ESR WNL Urine protein creatinine WNL Complements WNL

## 2023-06-07 NOTE — Progress Notes (Signed)
CRP within normal limits.

## 2023-06-08 LAB — C-REACTIVE PROTEIN: CRP: <3 mg/L (ref <8.0)

## 2023-06-08 LAB — ANTI-DNA ANTIBODY, DOUBLE-STRANDED: ds DNA Ab: 3 [IU]/mL

## 2023-06-08 LAB — COMPREHENSIVE METABOLIC PANEL WITH GFR
AG Ratio: 1.2 (calc) (ref 1.0–2.5)
ALT: 14 U/L (ref 6–29)
AST: 13 U/L (ref 10–30)
Albumin: 4.4 g/dL (ref 3.6–5.1)
Alkaline phosphatase (APISO): 72 U/L (ref 31–125)
BUN: 7 mg/dL (ref 7–25)
CO2: 24 mmol/L (ref 20–32)
Calcium: 9.2 mg/dL (ref 8.6–10.2)
Chloride: 105 mmol/L (ref 98–110)
Creat: 0.6 mg/dL (ref 0.50–0.96)
Globulin: 3.8 g/dL — ABNORMAL HIGH (ref 1.9–3.7)
Glucose, Bld: 72 mg/dL (ref 65–99)
Potassium: 3.9 mmol/L (ref 3.5–5.3)
Sodium: 136 mmol/L (ref 135–146)
Total Bilirubin: 0.3 mg/dL (ref 0.2–1.2)
Total Protein: 8.2 g/dL — ABNORMAL HIGH (ref 6.1–8.1)
eGFR: 127 mL/min/{1.73_m2} (ref >=60)

## 2023-06-08 LAB — CBC WITH DIFFERENTIAL/PLATELET
Absolute Lymphocytes: 2250 {cells}/uL (ref 850–3900)
Absolute Monocytes: 555 {cells}/uL (ref 200–950)
Basophils Absolute: 89 {cells}/uL (ref 0–200)
Basophils Relative: 1.2 %
Eosinophils Absolute: 192 {cells}/uL (ref 15–500)
Eosinophils Relative: 2.6 %
HCT: 34.7 % — ABNORMAL LOW (ref 35.0–45.0)
Hemoglobin: 10.1 g/dL — ABNORMAL LOW (ref 11.7–15.5)
MCH: 20.5 pg — ABNORMAL LOW (ref 27.0–33.0)
MCHC: 29.1 g/dL — ABNORMAL LOW (ref 32.0–36.0)
MCV: 70.4 fL — ABNORMAL LOW (ref 80.0–100.0)
Monocytes Relative: 7.5 %
Neutro Abs: 4314 {cells}/uL (ref 1500–7800)
Neutrophils Relative %: 58.3 %
Platelets: 292 10*3/uL (ref 140–400)
RBC: 4.93 10*6/uL (ref 3.80–5.10)
RDW: 18.2 % — ABNORMAL HIGH (ref 11.0–15.0)
Total Lymphocyte: 30.4 %
WBC: 7.4 10*3/uL (ref 3.8–10.8)

## 2023-06-08 LAB — PROTEIN / CREATININE RATIO, URINE
Creatinine, Urine: 124 mg/dL (ref 20–275)
Protein/Creat Ratio: 56 mg/g{creat} (ref 24–184)
Protein/Creatinine Ratio: 0.056 mg/mg{creat} (ref 0.024–0.184)
Total Protein, Urine: 7 mg/dL (ref 5–24)

## 2023-06-08 LAB — TEST AUTHORIZATION

## 2023-06-08 LAB — IRON,TIBC AND FERRITIN PANEL
%SAT: 4 % — ABNORMAL LOW (ref 16–45)
Ferritin: 3 ng/mL — ABNORMAL LOW (ref 16–154)
Iron: 20 ug/dL — ABNORMAL LOW (ref 40–190)
TIBC: 508 ug/dL — ABNORMAL HIGH (ref 250–450)

## 2023-06-08 LAB — C3 AND C4
C3 Complement: 152 mg/dL (ref 83–193)
C4 Complement: 16 mg/dL (ref 15–57)

## 2023-06-08 LAB — SEDIMENTATION RATE: Sed Rate: 17 mm/h (ref 0–20)

## 2023-06-08 NOTE — Progress Notes (Signed)
 Iron and ferritin low.  Please clarify if she is taking an iron supplement? Please forward to PCP to discuss treatment options-oral iron vs. infusion

## 2023-06-27 NOTE — Progress Notes (Deleted)
 Office Visit Note  Patient: Cheryl Davila             Date of Birth: 13-Aug-1995           MRN: [id number]             PCP: [facility], [facility] Referring: Medicine, [facility]* Visit Date: 07/09/2023 Occupation: @GUAROCC @  Subjective:  No chief complaint on file.   History of Present Illness: Cheryl Davila is a 28 y.o. female ***     Activities of Daily Living:  Patient reports morning stiffness for *** {minute/hour:19697}.   Patient {ACTIONS;DENIES/REPORTS:21021675::"Denies"} nocturnal pain.  Difficulty dressing/grooming: {ACTIONS;DENIES/REPORTS:21021675::"Denies"} Difficulty climbing stairs: {ACTIONS;DENIES/REPORTS:21021675::"Denies"} Difficulty getting out of chair: {ACTIONS;DENIES/REPORTS:21021675::"Denies"} Difficulty using hands for taps, buttons, cutlery, and/or writing: {ACTIONS;DENIES/REPORTS:21021675::"Denies"}  No Rheumatology ROS completed.   PMFS History:  Patient Active Problem List   Diagnosis Date Noted  . VBAC, delivered 01/08/2022  . Systemic lupus complicating pregnancy (HCC) 01/07/2022  . Family history of heart block 11/07/2021  . 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 Supervision of high risk pregnancy, antepartum 08/08/2021  . SLE (systemic lupus erythematosus related syndrome) (HCC) 08/08/2021  . H/O cesarean section 08/08/2021  . 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 Lupus   . 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 Depression Mother   . Healthy Sister   . Healthy Sister   . Healthy Brother   . Healthy Brother   . Healthy Brother   . Hypertension Maternal Grandmother   . Diabetes Maternal Grandmother   . Congenital heart disease Daughter        pacemaker  . Healthy Son   . Asthma Neg Hx   . Cancer Neg Hx   . Stroke Neg Hx    Past Surgical History:  Procedure Laterality Date  . CESAREAN SECTION  2021  .  WISDOM TOOTH EXTRACTION Bilateral    Social History   Social History Narrative  . Not on file   Immunization History  Administered Date(s) Administered  . Tdap 08/29/2019, 10/20/2021     Objective: Vital Signs: There were no vitals taken for this visit.   Physical Exam   Musculoskeletal Exam: ***  CDAI Exam: CDAI Score: -- Patient Global: --; Provider Global: -- Swollen: --; Tender: -- Joint Exam 07/09/2023   No joint exam has been documented for this visit   There is currently no information documented on the homunculus. Go to the Rheumatology activity and complete the homunculus joint exam.  Investigation: No additional findings.  Imaging: No results found.  Recent Labs: Lab Results  Component Value Date   WBC 7.4 06/06/2023   HGB 10.1 (L) 06/06/2023   PLT 292 06/06/2023   NA 136 06/06/2023   K 3.9 06/06/2023   CL 105 06/06/2023   CO2 24 06/06/2023   GLUCOSE 72 06/06/2023   BUN 7 06/06/2023   CREATININE 0.60 06/06/2023   BILITOT 0.3 06/06/2023   AST 13 06/06/2023   ALT 14 06/06/2023   PROT 8.2 (H) 06/06/2023   CALCIUM 9.2 06/06/2023   QFTBGOLDPLUS NEGATIVE 08/17/2022    Speciality Comments: PLQ Eye Exam: 10/04/2022 WNL @ [facility] Follow up in 1 year  Procedures:  No procedures performed Allergies: Patient has no known allergies.   Assessment / Plan:     Visit Diagnoses: No diagnosis found.  Orders: No orders of the defined types were  placed in this encounter.  No orders of the defined types were placed in this encounter.   Face-to-face time spent with patient was *** minutes. Greater than 50% of time was spent in counseling and coordination of care.  Follow-Up Instructions: No follow-ups on file.   Dee Farber, CMA  Note - This record has been created using Animal nutritionist.  Chart creation errors have been sought, but may not always  have been located. Such creation errors do not reflect on  the standard of medical  care.

## 2023-07-09 ENCOUNTER — Ambulatory Visit: Payer: Medicaid Other | Admitting: Physician Assistant

## 2023-07-09 DIAGNOSIS — Z79899 Other long term (current) drug therapy: Secondary | ICD-10-CM

## 2023-07-09 DIAGNOSIS — R5383 Other fatigue: Secondary | ICD-10-CM

## 2023-07-09 DIAGNOSIS — M3219 Other organ or system involvement in systemic lupus erythematosus: Secondary | ICD-10-CM

## 2023-07-09 DIAGNOSIS — M79641 Pain in right hand: Secondary | ICD-10-CM

## 2023-07-09 DIAGNOSIS — R760 Raised antibody titer: Secondary | ICD-10-CM

## 2023-07-09 DIAGNOSIS — O35BXX0 Maternal care for other (suspected) fetal abnormality and damage, fetal cardiac anomalies, not applicable or unspecified: Secondary | ICD-10-CM

## 2023-08-15 ENCOUNTER — Other Ambulatory Visit: Payer: Self-pay | Admitting: Rheumatology

## 2023-08-15 NOTE — Telephone Encounter (Signed)
 Last Fill: 04/16/2023  Eye exam: 10/04/2022 WNL   Labs: 06/06/2023 Patient remains anemic. Please see if iron panel can be added?  Total protein is slightly elevated-8.2. rest of CMP WNL  ESR WNL  Urine protein creatinine WNL  Complements WNL   Next Visit: 09/03/2023  Last Visit: 06/06/2023  DX: Other systemic lupus erythematosus with other organ involvement   Current Dose per office note 06/06/2023: Plaquenil  200 mg 1 tablet by mouth twice daily   Okay to refill Plaquenil ?

## 2023-08-20 NOTE — Progress Notes (Unsigned)
 Office Visit Note  Patient: Cheryl Davila             Date of Birth: 08-Dec-1995           MRN: [id number]             PCP: Medicine, [facility] (Inactive) Referring: Medicine, [facility]* Visit Date: 09/03/2023 Occupation: @GUAROCC @  Subjective:  Medication monitoring  History of Present Illness: Cheryl Davila is a 28 y.o. female with history of systemic lupus.  Patient remains on plaquenil  200 mg 1 tablet by mouth twice daily.  She is tolerating Plaquenil  without any side effects and has not had any recent gaps in therapy.  She denies any signs or symptoms of a systemic lupus flare.  She has occasional arthralgias and joint stiffness but denies any joint swelling.  Patient states that overall her hand pain has improved.  She denies any recent oral or nasal ulcerations.  She has not noticed any sicca symptoms.  She has not noticed any increased photosensitivity and has not had any recent rashes.  She denies any hair loss.  Her energy level has been stable.  She denies any new medical conditions.    Activities of Daily Living:  Patient reports morning stiffness for 30 minutes.   Patient Denies nocturnal pain.  Difficulty dressing/grooming: Denies Difficulty climbing stairs: Denies Difficulty getting out of chair: Denies Difficulty using hands for taps, buttons, cutlery, and/or writing: Denies  Review of Systems  Constitutional:  Negative for fatigue.  HENT:  Negative for mouth sores and mouth dryness.   Eyes:  Negative for dryness.  Respiratory:  Negative for shortness of breath.   Cardiovascular:  Negative for chest pain and palpitations.  Gastrointestinal:  Negative for blood in stool, constipation and diarrhea.  Endocrine: Negative for increased urination.  Genitourinary:  Negative for involuntary urination.  Musculoskeletal:  Positive for morning stiffness. Negative for joint pain, gait problem, joint pain, joint swelling, myalgias, muscle weakness, muscle  tenderness and myalgias.  Skin:  Negative for color change, rash, hair loss and sensitivity to sunlight.  Allergic/Immunologic: Negative for susceptible to infections.  Neurological:  Negative for dizziness and headaches.  Hematological:  Negative for swollen glands.  Psychiatric/Behavioral:  Negative for depressed mood and sleep disturbance. The patient is not nervous/anxious.     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    Family History  Problem Relation Age of Onset   Depression Mother    Healthy Sister    Healthy Sister    Healthy Brother    Healthy Brother    Healthy Brother    Hypertension Maternal Grandmother    Diabetes Maternal Grandmother    Congenital heart disease Daughter        pacemaker   Healthy Son    Asthma Neg Hx    Cancer Neg Hx    Stroke Neg Hx    Past Surgical History:  Procedure Laterality Date   CESAREAN SECTION  2021   WISDOM TOOTH EXTRACTION Bilateral  Social History   Social History Narrative   Not on file   Immunization History  Administered Date(s) Administered   Tdap 08/29/2019, 10/20/2021     Objective: Vital Signs: BP 113/67 (BP Location: Left Arm, Patient Position: Sitting, Cuff Size: Small)   Pulse 92   Resp 12   Ht 5' 7 (1.702 m)   Wt 175 lb 12.8 oz (79.7 kg)   LMP 08/20/2023   BMI 27.53 kg/m    Physical Exam Vitals and nursing note reviewed.  Constitutional:      Appearance: She is well-developed.  HENT:     Head:  Normocephalic and atraumatic.  Eyes:     Conjunctiva/sclera: Conjunctivae normal.  Cardiovascular:     Rate and Rhythm: Normal rate and regular rhythm.     Heart sounds: Normal heart sounds.  Pulmonary:     Effort: Pulmonary effort is normal.     Breath sounds: Normal breath sounds.  Abdominal:     General: Bowel sounds are normal.     Palpations: Abdomen is soft.  Musculoskeletal:     Cervical back: Normal range of motion.  Lymphadenopathy:     Cervical: No cervical adenopathy.  Skin:    General: Skin is warm and dry.     Capillary Refill: Capillary refill takes less than 2 seconds.  Neurological:     Mental Status: She is alert and oriented to person, place, and time.  Psychiatric:        Behavior: Behavior normal.      Musculoskeletal Exam: C-spine, thoracic spine, and lumbar spine good range of motion.  Shoulder joints, elbow joints, wrist joints, MCPs, PIPs, DIPs have good range of motion with no synovitis.  Complete fist formation bilaterally.  Hip joints have good assistance with position.  Knee joints have good range of motion no warmth or effusion.  Ankle joints have good range of motion with no tenderness or joint swelling.  No tenderness or synovitis over MTP joints.  Mild tenderness long the right 5th metatarsal-no ecchymosis, erythema, or swelling noted.  CDAI Exam: CDAI Score: -- Patient Global: --; Provider Global: -- Swollen: --; Tender: -- Joint Exam 09/03/2023   No joint exam has been documented for this visit   There is currently no information documented on the homunculus. Go to the Rheumatology activity and complete the homunculus joint exam.  Investigation: No additional findings.  Imaging: No results found.  Recent Labs: Lab Results  Component Value Date   WBC 7.4 06/06/2023   HGB 10.1 (L) 06/06/2023   PLT 292 06/06/2023   NA 136 06/06/2023   K 3.9 06/06/2023   CL 105 06/06/2023   CO2 24 06/06/2023   GLUCOSE 72 06/06/2023   BUN 7  06/06/2023   CREATININE 0.60 06/06/2023   BILITOT 0.3 06/06/2023   AST 13 06/06/2023   ALT 14 06/06/2023   PROT 8.2 (H) 06/06/2023   CALCIUM 9.2 06/06/2023   QFTBGOLDPLUS NEGATIVE 08/17/2022    Speciality Comments: PLQ Eye Exam: 10/04/2022 WNL @ [facility] Follow up in 1 year  Procedures:  No procedures performed Allergies: Patient has no known allergies.    Assessment / Plan:     Visit Diagnoses: Other systemic lupus erythematosus with other organ involvement (HCC) - Positive ANA, positive SSA, positive lupus anticoagulant, history of fatigue, joint pain, chest tightness, fetal congenital heart block 2021: She is not exhibiting any signs or symptoms of a systemic lupus flare.  She has clinically been doing well taking Plaquenil  200  mg 1 tablet by mouth twice daily.  She is tolerating Plaquenil  without any side effects and has not had any recent gaps in therapy.  She has not had any recent oral or nasal ulcerations.  No sicca symptoms.  She has been using a mouth rinse on a daily basis which has helped alleviate her mouth dryness.  She has not had any recent rashes or increased photosensitivity.  No hair loss-no signs of alopecia noted.  No shortness of breath or pleuritic chest pain.  Her energy level has been stable.  She has no synovitis on examination today. Lab work from 06/06/23 was reviewed today in the office: ESR and CRP WNL, complements WNL, dsDNA negative, and urine protein creatinine ratio WNL.  Plan to obtain the following lab work today for further evaluation.  She will notify us  if she develops any signs or symptoms of a flare.  No medication changes will be made at this time.  She will follow-up in the office in 3 months or sooner if needed. - Plan: Protein / creatinine ratio, urine, CBC with Differential/Platelet, Sedimentation rate, C3 and C4, Comprehensive metabolic panel with GFR, Anti-DNA antibody, double-stranded  Lupus anticoagulant positive - Lupus  anticoagulant not detected on 06/27/2022.  Repeat lupus anticoagulant has been negative.  High risk medication use - Plaquenil  200 mg 1 tablet by mouth twice daily- started in early April 2023.  CBC and CMP updated on 06/06/23.  Orders for CBC and CMP were released today.  PLQ Eye Exam: 10/04/2022 WNL @ [facility] Follow up in 1 year-encouraged the patient to call to schedule an updated plaquenil  eye examination.  No new medical conditions.   No recurrent infections.  Plan: CBC with Differential/Platelet, Comprehensive metabolic panel with GFR  Pain in both hands - X-rays of both hands were unremarkable on 06/27/2022.  She has no tenderness or synovitis on examination today.  She was able to make a complete fist bilaterally.  She will remain on Plaquenil  as prescribed.  Other fatigue: She is not currently experiencing any fatigue at this time.     Other medical conditions are listed as follows:   Heart block, fetal, affecting care of mother: Daughter   Orders: Orders Placed This Encounter  Procedures   Protein / creatinine ratio, urine   CBC with Differential/Platelet   Sedimentation rate   C3 and C4   Comprehensive metabolic panel with GFR   Anti-DNA antibody, double-stranded   No orders of the defined types were placed in this encounter.   Follow-Up Instructions: Return in about 3 months (around 12/04/2023) for Systemic lupus erythematosus.   Waddell CHRISTELLA Craze, PA-C  Note - This record has been created using Dragon software.  Chart creation errors have been sought, but may not always  have been located. Such creation errors do not reflect on  the standard of medical care.

## 2023-09-03 ENCOUNTER — Ambulatory Visit: Attending: Physician Assistant | Admitting: Physician Assistant

## 2023-09-03 ENCOUNTER — Encounter: Payer: Self-pay | Admitting: Physician Assistant

## 2023-09-03 VITALS — BP 113/67 | HR 92 | Resp 12 | Ht 67.0 in | Wt 175.8 lb

## 2023-09-03 DIAGNOSIS — O35BXX0 Maternal care for other (suspected) fetal abnormality and damage, fetal cardiac anomalies, not applicable or unspecified: Secondary | ICD-10-CM

## 2023-09-03 DIAGNOSIS — M3219 Other organ or system involvement in systemic lupus erythematosus: Secondary | ICD-10-CM | POA: Diagnosis not present

## 2023-09-03 DIAGNOSIS — Z79899 Other long term (current) drug therapy: Secondary | ICD-10-CM

## 2023-09-03 DIAGNOSIS — R760 Raised antibody titer: Secondary | ICD-10-CM

## 2023-09-03 DIAGNOSIS — M79641 Pain in right hand: Secondary | ICD-10-CM | POA: Diagnosis not present

## 2023-09-03 DIAGNOSIS — M79642 Pain in left hand: Secondary | ICD-10-CM

## 2023-09-03 DIAGNOSIS — R5383 Other fatigue: Secondary | ICD-10-CM

## 2023-09-04 ENCOUNTER — Ambulatory Visit: Payer: Self-pay | Admitting: Physician Assistant

## 2023-09-04 DIAGNOSIS — D649 Anemia, unspecified: Secondary | ICD-10-CM

## 2023-09-04 DIAGNOSIS — E611 Iron deficiency: Secondary | ICD-10-CM

## 2023-09-04 NOTE — Progress Notes (Signed)
 No proteinuria.   CMP WNL ESR WNL Patient remains anemic--hemoglobin is low at 9.7.  MCV, MCH, and MCHC low--please add iron panel

## 2023-09-05 LAB — COMPREHENSIVE METABOLIC PANEL WITH GFR
AG Ratio: 1.4 (calc) (ref 1.0–2.5)
ALT: 14 U/L (ref 6–29)
AST: 16 U/L (ref 10–30)
Albumin: 4.6 g/dL (ref 3.6–5.1)
Alkaline phosphatase (APISO): 76 U/L (ref 31–125)
BUN: 11 mg/dL (ref 7–25)
CO2: 24 mmol/L (ref 20–32)
Calcium: 9.5 mg/dL (ref 8.6–10.2)
Chloride: 103 mmol/L (ref 98–110)
Creat: 0.65 mg/dL (ref 0.50–0.96)
Globulin: 3.3 g/dL (ref 1.9–3.7)
Glucose, Bld: 73 mg/dL (ref 65–99)
Potassium: 3.9 mmol/L (ref 3.5–5.3)
Sodium: 138 mmol/L (ref 135–146)
Total Bilirubin: 0.4 mg/dL (ref 0.2–1.2)
Total Protein: 7.9 g/dL (ref 6.1–8.1)
eGFR: 124 mL/min/1.73m2 (ref >=60)

## 2023-09-05 LAB — SEDIMENTATION RATE: Sed Rate: 19 mm/h (ref 0–20)

## 2023-09-05 LAB — CBC WITH DIFFERENTIAL/PLATELET
Absolute Lymphocytes: 2410 {cells}/uL (ref 850–3900)
Absolute Monocytes: 482 {cells}/uL (ref 200–950)
Basophils Absolute: 71 {cells}/uL (ref 0–200)
Basophils Relative: 0.9 %
Eosinophils Absolute: 71 {cells}/uL (ref 15–500)
Eosinophils Relative: 0.9 %
HCT: 33.9 % — ABNORMAL LOW (ref 35.0–45.0)
Hemoglobin: 9.7 g/dL — ABNORMAL LOW (ref 11.7–15.5)
MCH: 20.6 pg — ABNORMAL LOW (ref 27.0–33.0)
MCHC: 28.6 g/dL — ABNORMAL LOW (ref 32.0–36.0)
MCV: 71.8 fL — ABNORMAL LOW (ref 80.0–100.0)
Monocytes Relative: 6.1 %
Neutro Abs: 4866 {cells}/uL (ref 1500–7800)
Neutrophils Relative %: 61.6 %
Platelets: 318 Thousand/uL (ref 140–400)
RBC: 4.72 Million/uL (ref 3.80–5.10)
RDW: 17 % — ABNORMAL HIGH (ref 11.0–15.0)
Total Lymphocyte: 30.5 %
WBC: 7.9 Thousand/uL (ref 3.8–10.8)

## 2023-09-05 LAB — TEST AUTHORIZATION

## 2023-09-05 LAB — C3 AND C4
C3 Complement: 88 mg/dL (ref 83–193)
C4 Complement: 9 mg/dL — ABNORMAL LOW (ref 15–57)

## 2023-09-05 LAB — PROTEIN / CREATININE RATIO, URINE
Creatinine, Urine: 45 mg/dL (ref 20–275)
Total Protein, Urine: <4 mg/dL — ABNORMAL LOW (ref 5–24)

## 2023-09-05 LAB — ANTI-DNA ANTIBODY, DOUBLE-STRANDED: ds DNA Ab: 3 [IU]/mL

## 2023-09-05 LAB — IRON, TOTAL/TOTAL IRON BINDING CAP
%SAT: 5 % — ABNORMAL LOW (ref 16–45)
Iron: 24 ug/dL — ABNORMAL LOW (ref 40–190)
TIBC: 498 ug/dL — ABNORMAL HIGH (ref 250–450)

## 2023-09-05 NOTE — Progress Notes (Signed)
 Iron remains low-please clarify how is managing her iron deficiency anemia?  Clarify if she needs a referral to hematology?    dsDNA is negative.

## 2023-09-06 NOTE — Progress Notes (Signed)
 Recommend referral to hematology for iron deficiency anemia management.    C4 low. C3 WNL.  dsDNA negative and ESR WNL.  We will continue to monitor lab work closely.

## 2023-10-09 ENCOUNTER — Inpatient Hospital Stay: Admitting: Oncology

## 2023-10-09 ENCOUNTER — Inpatient Hospital Stay

## 2023-11-13 ENCOUNTER — Inpatient Hospital Stay

## 2023-11-13 ENCOUNTER — Telehealth: Payer: Self-pay | Admitting: Nurse Practitioner

## 2023-11-13 ENCOUNTER — Inpatient Hospital Stay: Admitting: Oncology

## 2023-11-13 NOTE — Telephone Encounter (Signed)
 PT is not feeling well and needed to reschedule new hem appt. Day and time confirmed.

## 2023-11-20 NOTE — Progress Notes (Deleted)
 Office Visit Note  Patient: Cheryl Davila             Date of Birth: 1995-06-01           MRN: [id number]             PCP: Medicine, [facility] (Inactive) Referring: No ref. provider found Visit Date: 12/04/2023 Occupation: Data Unavailable  Subjective:    History of Present Illness: Cheryl Davila is a 28 y.o. female with history of systemic lupus.  Patient remains on plaquenil  200 mg 1 tablet by mouth twice daily.     Lab work from 09/03/23 was reviewed today in the office: dsDNA negative, C4 low-9, C3 WNL, ESR WNL.  Plan to update the following lab work today.   PLQ Eye Exam: 10/04/2022 WNL @ [facility] Follow up in 1 year  CMP updated on 09/03/23. Order for CMP released today.  CBC updated on 11/26/23.  Activities of Daily Living:  Patient reports morning stiffness for *** {minute/hour:19697}.   Patient {ACTIONS;DENIES/REPORTS:21021675::Denies} nocturnal pain.  Difficulty dressing/grooming: {ACTIONS;DENIES/REPORTS:21021675::Denies} Difficulty climbing stairs: {ACTIONS;DENIES/REPORTS:21021675::Denies} Difficulty getting out of chair: {ACTIONS;DENIES/REPORTS:21021675::Denies} Difficulty using hands for taps, buttons, cutlery, and/or writing: {ACTIONS;DENIES/REPORTS:21021675::Denies}  No Rheumatology ROS completed.   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    Family History  Problem Relation Age of Onset   Depression Mother    Healthy  Sister    Healthy Sister    Healthy Brother    Healthy Brother    Healthy Brother    Hypertension Maternal Grandmother    Diabetes Maternal Grandmother    Congenital heart disease Daughter        pacemaker   Healthy Son    Asthma Neg Hx    Cancer Neg Hx    Stroke Neg Hx    Past Surgical History:  Procedure Laterality Date   CESAREAN SECTION  2021   WISDOM TOOTH EXTRACTION Bilateral    Social History   Tobacco Use   Smoking status: Never    Passive exposure: Never   Smokeless tobacco: Never  Vaping Use   Vaping status: Never Used  Substance Use Topics   Alcohol use: No   Drug use: No   Social History   Social History Narrative   Not on file     Immunization History  Administered Date(s) Administered   Tdap 08/29/2019, 10/20/2021     Objective: Vital Signs: There were no vitals taken for this visit.   Physical Exam Vitals and nursing note reviewed.  Constitutional:      Appearance: She is well-developed.  HENT:     Head: Normocephalic and atraumatic.  Eyes:     Conjunctiva/sclera: Conjunctivae normal.  Cardiovascular:     Rate and Rhythm: Normal rate and regular rhythm.     Heart sounds: Normal heart sounds.  Pulmonary:     Effort: Pulmonary effort is normal.     Breath sounds: Normal breath sounds.  Abdominal:     General: Bowel sounds are normal.  Palpations: Abdomen is soft.  Musculoskeletal:     Cervical back: Normal range of motion.  Lymphadenopathy:     Cervical: No cervical adenopathy.  Skin:    General: Skin is warm and dry.     Capillary Refill: Capillary refill takes less than 2 seconds.  Neurological:     Mental Status: She is alert and oriented to person, place, and time.  Psychiatric:        Behavior: Behavior normal.      Musculoskeletal Exam: ***  CDAI Exam: CDAI Score: -- Patient Global: --; Provider Global: -- Swollen: --; Tender: -- Joint Exam 12/04/2023   No joint exam has been documented for this visit   There  is currently no information documented on the homunculus. Go to the Rheumatology activity and complete the homunculus joint exam.  Investigation: No additional findings.  Imaging: No results found.  Recent Labs: Lab Results  Component Value Date   WBC 7.9 09/03/2023   HGB 9.7 (L) 09/03/2023   PLT 318 09/03/2023   NA 138 09/03/2023   K 3.9 09/03/2023   CL 103 09/03/2023   CO2 24 09/03/2023   GLUCOSE 73 09/03/2023   BUN 11 09/03/2023   CREATININE 0.65 09/03/2023   BILITOT 0.4 09/03/2023   AST 16 09/03/2023   ALT 14 09/03/2023   PROT 7.9 09/03/2023   CALCIUM 9.5 09/03/2023   QFTBGOLDPLUS NEGATIVE 08/17/2022    Speciality Comments: PLQ Eye Exam: 10/04/2022 WNL @ [facility] Follow up in 1 year  Procedures:  No procedures performed Allergies: Patient has no known allergies.   Assessment / Plan:     Visit Diagnoses: Other systemic lupus erythematosus with other organ involvement (HCC)  Lupus anticoagulant positive  High risk medication use  Pain in both hands  Other fatigue  Heart block, fetal, affecting care of mother  Iron deficiency  Orders: No orders of the defined types were placed in this encounter.  No orders of the defined types were placed in this encounter.   Face-to-face time spent with patient was *** minutes. Greater than 50% of time was spent in counseling and coordination of care.  Follow-Up Instructions: No follow-ups on file.   Waddell CHRISTELLA Craze, PA-C  Note - This record has been created using Dragon software.  Chart creation errors have been sought, but may not always  have been located. Such creation errors do not reflect on  the standard of medical care.

## 2023-11-21 ENCOUNTER — Other Ambulatory Visit: Payer: Self-pay | Admitting: Nurse Practitioner

## 2023-11-21 DIAGNOSIS — D509 Iron deficiency anemia, unspecified: Secondary | ICD-10-CM

## 2023-11-26 ENCOUNTER — Encounter: Payer: Self-pay | Admitting: Nurse Practitioner

## 2023-11-26 ENCOUNTER — Inpatient Hospital Stay

## 2023-11-26 ENCOUNTER — Inpatient Hospital Stay: Attending: Nurse Practitioner | Admitting: Nurse Practitioner

## 2023-11-26 VITALS — BP 107/78 | HR 65 | Temp 97.9°F | Resp 15 | Ht 67.0 in | Wt 179.0 lb

## 2023-11-26 DIAGNOSIS — D509 Iron deficiency anemia, unspecified: Secondary | ICD-10-CM | POA: Insufficient documentation

## 2023-11-26 DIAGNOSIS — M329 Systemic lupus erythematosus, unspecified: Secondary | ICD-10-CM

## 2023-11-26 LAB — CBC WITH DIFFERENTIAL (CANCER CENTER ONLY)
Abs Immature Granulocytes: 0.01 K/uL (ref 0.00–0.07)
Basophils Absolute: 0.1 K/uL (ref 0.0–0.1)
Basophils Relative: 1 %
Eosinophils Absolute: 0.1 K/uL (ref 0.0–0.5)
Eosinophils Relative: 1 %
HCT: 32.4 % — ABNORMAL LOW (ref 36.0–46.0)
Hemoglobin: 9.7 g/dL — ABNORMAL LOW (ref 12.0–15.0)
Immature Granulocytes: 0 %
Lymphocytes Relative: 37 %
Lymphs Abs: 2.9 K/uL (ref 0.7–4.0)
MCH: 20.9 pg — ABNORMAL LOW (ref 26.0–34.0)
MCHC: 29.9 g/dL — ABNORMAL LOW (ref 30.0–36.0)
MCV: 69.7 fL — ABNORMAL LOW (ref 80.0–100.0)
Monocytes Absolute: 0.5 K/uL (ref 0.1–1.0)
Monocytes Relative: 7 %
Neutro Abs: 4.2 K/uL (ref 1.7–7.7)
Neutrophils Relative %: 54 %
Platelet Count: 364 K/uL (ref 150–400)
RBC: 4.65 MIL/uL (ref 3.87–5.11)
RDW: 18 % — ABNORMAL HIGH (ref 11.5–15.5)
WBC Count: 7.7 K/uL (ref 4.0–10.5)
nRBC: 0 % (ref 0.0–0.2)

## 2023-11-26 LAB — IRON AND TIBC
Iron: 16 ug/dL — ABNORMAL LOW (ref 28–170)
Saturation Ratios: 3 % — ABNORMAL LOW (ref 10.4–31.8)
TIBC: 543 ug/dL — ABNORMAL HIGH (ref 250–450)
UIBC: 527 ug/dL

## 2023-11-26 LAB — FERRITIN: Ferritin: 5 ng/mL — ABNORMAL LOW (ref 11–307)

## 2023-11-26 LAB — SAVE SMEAR(SSMR), FOR PROVIDER SLIDE REVIEW

## 2023-11-26 NOTE — Progress Notes (Unsigned)
 New Hematology/Oncology Consult   Requesting MD: Dr. Maya Nash  [phone]      Reason for Consult: Iron deficiency anemia  HPI: [name] is a 28 year old woman referred for evaluation of iron deficiency anemia.  She had labs completed 09/03/2023-hemoglobin 9.7, MCV 71, white count 7.9, platelet count 318,000, normal chemistry panel, iron 24, TIBC 498, percent saturation 5, sed rate 19.  Labs from 06/06/2023-hemoglobin 10.1, MCV 70, iron 20, TIBC 508, percent saturation 4, ferritin 3.  CBC 04/16/2019 hemoglobin 12, MCV 81.  Past medical history significant for SLE, currently on Plaquenil .  In general she feels well.  Only bleeding is her monthly menstrual cycle which lasts 5 days in total.  2 days are heavy with passage of blood clots.  Overall good energy level.  She craves ice.  No tongue soreness.  Intermittently notes cracks at the corners of the lips.  Nails are brittle.  She eats a regular diet.  She denies bloody/black stools.  No hematuria.   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     Past Surgical History:  Procedure Laterality Date   CESAREAN SECTION  2021   WISDOM TOOTH EXTRACTION Bilateral      Current Outpatient Medications:    hydroxychloroquine  (PLAQUENIL ) 200 MG tablet, TAKE 1 TABLET BY MOUTH TWICE A DAY, Disp: 180 tablet, Rfl: 0   norethindrone  (MICRONOR ) 0.35 MG tablet, TAKE 1 TABLET BY MOUTH EVERY DAY, Disp: 84 tablet, Rfl: 4   predniSONE  (DELTASONE ) 5 MG tablet, Take 4 tabs po x 4 days, 3  tabs po x 4 days, 2  tabs po x 4 days, 1  tab po x 4 days (Patient not taking: Reported on 11/26/2023), Disp: 40 tablet, Rfl: 0:    No Known Allergies:  FH: No family history of cancer.  She thinks her mother may be anemic.  SOCIAL HISTORY: She lives in [place].  She is married.  She has 2 children, daughter 69 & son age 20.  Her daughter has a pacemaker.  No tobacco or alcohol use.  Review of Systems: See HPI.   No fevers or sweats.  She has a good appetite.  No weight loss.  No shortness of breath.  No cough.  No change in bowel habits.  She has intermittent mouth sores.  No rash.  Physical Exam:  Blood pressure 107/78, pulse 65, temperature 97.9 F (36.6 C), temperature source Temporal, resp. rate 15, height 5' 7 (1.702 m), weight 179 lb (81.2 kg), last menstrual period 11/19/2023, SpO2 100%, not currently breastfeeding.  HEENT: No thrush or ulcers. Lungs: Lungs clear bilaterally. Cardiac: Regular rate and rhythm. Abdomen: No hepatosplenomegaly.  Nontender. Vascular: No leg edema. Lymph nodes: No palpable cervical, supraclavicular, axillary or inguinal lymph nodes. Neurologic: Alert and oriented. Skin: No rash.  LABS:   Recent Labs    11/26/23 1401  WBC 7.7  HGB 9.7*  HCT 32.4*  PLT 364  Peripheral blood smear-platelets appear normal in number;  white cell morphology unremarkable; red cell morphology assessment is limited by poor preparation-few teardrop, ovalocyte and target cells, polychromasia not increased, Red cells are microcytic.  No nucleated red cells.  No results for input(s): NA, K, CL, CO2, GLUCOSE, BUN, CREATININE, CALCIUM in the last 72 hours.    RADIOLOGY:  No results found.  Assessment and Plan:   Iron deficiency anemia SLE  Cheryl Davila appears to have iron deficiency anemia.  Source of blood loss is likely  the menstrual cycle.  She will begin ferrous sulfate 325 mg twice daily.  We discussed the possibility of constipation and nausea.  She will contact the office if she is unable to tolerate oral iron and we can consider IV iron.  Otherwise our plan is to see her back with repeat labs in 4 to 6 weeks.  Patient seen with Dr. Cloretta.  Olam Ned, NP 11/26/2023, 3:27 PM  This was a shared visit with Olam Ned.  Cheryl Davila was interviewed and examined.  I reviewed the peripheral blood smear.  She is referred for evaluation of anemia.  She  has iron deficiency anemia.  The iron deficiency is likely secondary to chronic menstrual blood loss.  Review of the medical record is consistent with a longstanding history of iron deficiency anemia.  She will begin oral iron replacement.  There is no indication for IV iron at present.  I was present for greater than 50% of today's visit.  I performed medical decision making.  Arvella Cloretta, MD

## 2023-11-27 ENCOUNTER — Other Ambulatory Visit: Payer: Self-pay | Admitting: Physician Assistant

## 2023-11-27 NOTE — Telephone Encounter (Signed)
 Last Fill: 08/15/2023  Eye exam: 10/04/2022 WNL    Labs: 11/26/2023 Hgb 9.7, Hct 32.4, MCV 69.7, MCH 20.9, MCHC 29.9, RDW 18.0 09/03/2023 CMP WNL   Next Visit: 12/04/2023  Last Visit: 09/03/2023  IK:Nuyzm systemic lupus erythematosus with other organ involvement   Current Dose per office note 09/03/2023: Plaquenil  200 mg 1 tablet by mouth twice daily   Patient states she has not updated her eye exam. Patient will schedule her eye exam and call back with the date.   Okay to refill Plaquenil ?

## 2023-12-04 ENCOUNTER — Ambulatory Visit: Payer: Self-pay | Admitting: Physician Assistant

## 2023-12-04 DIAGNOSIS — E611 Iron deficiency: Secondary | ICD-10-CM

## 2023-12-04 DIAGNOSIS — R5383 Other fatigue: Secondary | ICD-10-CM

## 2023-12-04 DIAGNOSIS — Z79899 Other long term (current) drug therapy: Secondary | ICD-10-CM

## 2023-12-04 DIAGNOSIS — M79641 Pain in right hand: Secondary | ICD-10-CM

## 2023-12-04 DIAGNOSIS — O35BXX0 Maternal care for other (suspected) fetal abnormality and damage, fetal cardiac anomalies, not applicable or unspecified: Secondary | ICD-10-CM

## 2023-12-04 DIAGNOSIS — R760 Raised antibody titer: Secondary | ICD-10-CM

## 2023-12-04 DIAGNOSIS — M3219 Other organ or system involvement in systemic lupus erythematosus: Secondary | ICD-10-CM

## 2023-12-17 NOTE — Progress Notes (Deleted)
 Cheryl Davila  Office Visit Note  Patient: Cheryl Davila             Date of Birth: 1996-02-20           MRN: [id number]             PCP: Medicine, [facility] (Inactive) Referring: No ref. provider found Visit Date: 12/31/2023 Occupation: Data Unavailable  Subjective:    History of Present Illness: Cheryl Davila is a 28 y.o. female with history of systemic lupus.  Patient remains on plaquenil  200 mg 1 tablet by mouth twice daily.     Lab work from 09/03/23 was reviewed today in the office: dsDNA negative, C4 low-9, C3 WNL, ESR WNL.  Plan to update the following lab work today.   PLQ Eye Exam: 10/04/2022 WNL @ [facility] Follow up in 1 year  CMP updated on 09/03/23. Order for CMP released today.  CBC updated on 11/26/23.  Activities of Daily Living:  Patient reports morning stiffness for *** {minute/hour:19697}.   Patient {ACTIONS;DENIES/REPORTS:21021675::Denies} nocturnal pain.  Difficulty dressing/grooming: {ACTIONS;DENIES/REPORTS:21021675::Denies} Difficulty climbing stairs: {ACTIONS;DENIES/REPORTS:21021675::Denies} Difficulty getting out of chair: {ACTIONS;DENIES/REPORTS:21021675::Denies} Difficulty using hands for taps, buttons, cutlery, and/or writing: {ACTIONS;DENIES/REPORTS:21021675::Denies}  Review of Systems  Constitutional:  Negative for fatigue.  HENT:  Negative for mouth sores, mouth dryness and nose dryness.   Eyes:  Negative for pain, visual disturbance and dryness.  Respiratory:  Negative for cough, hemoptysis, shortness of breath and difficulty breathing.   Cardiovascular:  Negative for chest pain, palpitations, hypertension and swelling in legs/feet.  Gastrointestinal: Negative.  Negative for blood in stool, constipation and diarrhea.  Endocrine: Negative for increased urination.  Genitourinary:  Negative for painful urination.  Musculoskeletal:  Negative for joint pain, joint pain, joint swelling, myalgias, muscle weakness, morning  stiffness, muscle tenderness and myalgias.  Skin:  Negative for color change, pallor, rash, hair loss, nodules/bumps, skin tightness, ulcers and sensitivity to sunlight.  Allergic/Immunologic: Negative for susceptible to infections.  Neurological:  Negative for dizziness, numbness, headaches and weakness.  Hematological:  Negative for swollen glands.  Psychiatric/Behavioral: Negative.  Negative for depressed mood and sleep disturbance. The patient is not nervous/anxious.     PMFS History:  Patient Active Problem List   Diagnosis Date Noted  . VBAC, delivered 01/08/2022  . Systemic lupus complicating pregnancy (HCC) 01/07/2022  . Family history of heart block 11/07/2021  . 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 Supervision of high risk pregnancy, antepartum 08/08/2021  . SLE (systemic lupus erythematosus related syndrome) (HCC) 08/08/2021  . H/O cesarean section 08/08/2021  . 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 Lupus   . 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 Depression Mother   . Healthy Sister   . Healthy Sister   . Healthy Brother   . Healthy Brother   . Healthy Brother   . Hypertension Maternal Grandmother   . Diabetes Maternal Grandmother   . Congenital heart disease Daughter        pacemaker  . Healthy Son   . Asthma Neg Hx   . Cancer Neg Hx   . Stroke Neg Hx    Past Surgical History:  Procedure Laterality Date  . CESAREAN SECTION  2021  . WISDOM TOOTH EXTRACTION Bilateral    Social History   Tobacco Use  . Smoking status: Never    Passive exposure: Never  . Smokeless  tobacco: Never  Vaping Use  . Vaping status: Never Used  Substance Use Topics  . Alcohol use: No  . Drug use: No   Social History   Social History Narrative  . Not on file     Immunization History  Administered Date(s) Administered  . Tdap 08/29/2019, 10/20/2021      Objective: Vital Signs: There were no vitals taken for this visit.   Physical Exam Vitals and nursing note reviewed.  Constitutional:      Appearance: She is well-developed.  HENT:     Head: Normocephalic and atraumatic.  Eyes:     Conjunctiva/sclera: Conjunctivae normal.  Cardiovascular:     Rate and Rhythm: Normal rate and regular rhythm.     Heart sounds: Normal heart sounds.  Pulmonary:     Effort: Pulmonary effort is normal.     Breath sounds: Normal breath sounds.  Abdominal:     General: Bowel sounds are normal.     Palpations: Abdomen is soft.  Musculoskeletal:     Cervical back: Normal range of motion.  Lymphadenopathy:     Cervical: No cervical adenopathy.  Skin:    General: Skin is warm and dry.     Capillary Refill: Capillary refill takes less than 2 seconds.  Neurological:     Mental Status: She is alert and oriented to person, place, and time.  Psychiatric:        Behavior: Behavior normal.      Musculoskeletal Exam: ***  CDAI Exam: CDAI Score: -- Patient Global: --; Provider Global: -- Swollen: --; Tender: -- Joint Exam 12/31/2023   No joint exam has been documented for this visit   There is currently no information documented on the homunculus. Go to the Rheumatology activity and complete the homunculus joint exam.  Investigation: No additional findings.  Imaging: No results found.  Recent Labs: Lab Results  Component Value Date   WBC 7.7 11/26/2023   HGB 9.7 (L) 11/26/2023   PLT 364 11/26/2023   NA 138 09/03/2023   K 3.9 09/03/2023   CL 103 09/03/2023   CO2 24 09/03/2023   GLUCOSE 73 09/03/2023   BUN 11 09/03/2023   CREATININE 0.65 09/03/2023   BILITOT 0.4 09/03/2023   AST 16 09/03/2023   ALT 14 09/03/2023   PROT 7.9 09/03/2023   CALCIUM 9.5 09/03/2023   QFTBGOLDPLUS NEGATIVE 08/17/2022    Speciality Comments: PLQ Eye Exam: 10/04/2022 WNL @ [facility] Follow up in 1 year  Patient scheduled PLQ eye exam for  01/25/2024, patient states that is the soonest they had available  Procedures:  No procedures performed Allergies: Patient has no known allergies.   Assessment / Plan:     Visit Diagnoses: Other systemic lupus erythematosus with other organ involvement (HCC)  Lupus anticoagulant positive  High risk medication use  Pain in both hands  Other fatigue  Heart block, fetal, affecting care of mother  Iron deficiency  Orders: No orders of the defined types were placed in this encounter.  No orders of the defined types were placed in this encounter.   Face-to-face time spent with patient was *** minutes. Greater than 50% of time was spent in counseling and coordination of care.  Follow-Up Instructions: No follow-ups on file.   Waddell CHRISTELLA Craze, PA-C  Note - This record has been created using Dragon software.  Chart creation errors have been sought, but may not always  have been located. Such creation errors do not reflect on  the  standard of medical care.

## 2023-12-27 ENCOUNTER — Inpatient Hospital Stay: Attending: Nurse Practitioner

## 2023-12-27 ENCOUNTER — Ambulatory Visit: Admitting: Nurse Practitioner

## 2023-12-27 ENCOUNTER — Inpatient Hospital Stay: Admitting: Nurse Practitioner

## 2023-12-27 ENCOUNTER — Other Ambulatory Visit

## 2023-12-31 ENCOUNTER — Ambulatory Visit: Attending: Physician Assistant | Admitting: Physician Assistant

## 2023-12-31 DIAGNOSIS — M79641 Pain in right hand: Secondary | ICD-10-CM

## 2023-12-31 DIAGNOSIS — R5383 Other fatigue: Secondary | ICD-10-CM

## 2023-12-31 DIAGNOSIS — Z79899 Other long term (current) drug therapy: Secondary | ICD-10-CM

## 2023-12-31 DIAGNOSIS — M3219 Other organ or system involvement in systemic lupus erythematosus: Secondary | ICD-10-CM

## 2023-12-31 DIAGNOSIS — R760 Raised antibody titer: Secondary | ICD-10-CM

## 2023-12-31 DIAGNOSIS — O35BXX0 Maternal care for other (suspected) fetal abnormality and damage, fetal cardiac anomalies, not applicable or unspecified: Secondary | ICD-10-CM

## 2023-12-31 DIAGNOSIS — E611 Iron deficiency: Secondary | ICD-10-CM

## 2024-01-02 ENCOUNTER — Other Ambulatory Visit: Payer: Self-pay | Admitting: Rheumatology

## 2024-01-02 MED ORDER — HYDROXYCHLOROQUINE SULFATE 200 MG PO TABS: 200.0000 mg | ORAL_TABLET | Freq: Two times a day (BID) | ORAL | 0 refills | Status: DC

## 2024-01-02 NOTE — Telephone Encounter (Signed)
 Patient states she is scheduled for a plaquenil  eye exam on 01/25/2024. Patient is requesting a refill of PLQ.   Last Fill: 11/27/2023 (30 day supply)  Eye exam: 10/04/2022 (patient is scheduled for 01/25/2024)   Labs: 11/26/2023 CBC: hemoglobin 9.7, HCT 32.4, MCV 69.7, MCH 20.9, MCHC 29.9, RDW 18.0  09/03/2023  No proteinuria.   CMP WNL ESR WNL Patient remains anemic--hemoglobin is low at 9.7.  MCV, MCH, and MCHC low dsDNA is negative.  C4 low. C3 WNL. dsDNA negative and ESR WNL. We will continue to monitor lab work closely.   Next Visit: 01/14/2024  Last Visit: 09/03/2023  IK:Nuyzm systemic lupus erythematosus with other organ involvement   Current Dose per office note on 09/03/2023: Plaquenil  200 mg 1 tablet by mouth twice daily   Okay to refill Plaquenil ?

## 2024-01-02 NOTE — Telephone Encounter (Signed)
 Patient would like someone in clinic to call her about her medication. Pt rescheduled her f/u appointment as well.

## 2024-01-13 NOTE — Progress Notes (Unsigned)
 [facility]  Office Visit Note  Patient: Cheryl Davila             Date of Birth: [birth year]           MRN: [id number]             PCP: Medicine, [facility] (Inactive) Referring: No ref. provider found Visit Date: 01/14/2024 Occupation: Data Unavailable  Subjective:  Mediation monitoring   History of Present Illness: Cheryl Davila is a 28 y.o. female with history of systemic lupus.  Patient remains on plaquenil  200 mg 1 tablet by mouth twice daily.  She is tolerating Plaquenil  without any side effects and has not had any gaps in therapy.  She denies any recent gaps in therapy.  She denies any signs or symptoms of a systemic lupus flare.  Patient reports that she has established care with hematology and has been formally diagnosed with anemia.  She has been started on ferrous sulfate which she has been tolerating.  She is no longer been craving ice and has noticed an improvement in her energy level. She denies any symptoms of Raynaud's phenomenon.  She has not had any recent rashes or hair loss.  She denies any oral or nasal ulcerations.  She denies any recurrent infections.  She denies any other new medical conditions. Patient states that she scheduled for an updated Plaquenil  eye examination on 01/24/2024.   Activities of Daily Living:  Patient reports morning stiffness for 1 hour.   Patient Denies nocturnal pain.  Difficulty dressing/grooming: Denies Difficulty climbing stairs: Denies Difficulty getting out of chair: Denies Difficulty using hands for taps, buttons, cutlery, and/or writing: Reports  Review of Systems  Constitutional:  Negative for fatigue.  HENT:  Negative for mouth sores and mouth dryness.   Eyes:  Negative for dryness.  Respiratory:  Negative for shortness of breath.   Cardiovascular:  Negative for chest pain and palpitations.  Gastrointestinal:  Negative for blood in stool, constipation and diarrhea.  Endocrine: Negative for increased urination.   Genitourinary:  Negative for involuntary urination.  Musculoskeletal:  Positive for morning stiffness. Negative for joint pain, gait problem, joint pain, joint swelling, myalgias, muscle weakness, muscle tenderness and myalgias.  Skin:  Negative for color change, rash, hair loss and sensitivity to sunlight.  Allergic/Immunologic: Positive for susceptible to infections.  Neurological:  Positive for dizziness and headaches.  Hematological:  Negative for swollen glands.  Psychiatric/Behavioral:  Negative for depressed mood and sleep disturbance. The patient is not nervous/anxious.     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    Family History  Problem Relation Age of Onset   Depression Mother    Healthy Sister    Healthy Sister    Healthy Brother    Healthy Brother    Healthy Brother    Hypertension Maternal Grandmother    Diabetes Maternal Grandmother    Congenital heart disease Daughter        pacemaker   Healthy Son  Asthma Neg Hx    Cancer Neg Hx    Stroke Neg Hx    Past Surgical History:  Procedure Laterality Date   CESAREAN SECTION  2021   WISDOM TOOTH EXTRACTION Bilateral    Social History   Tobacco Use   Smoking status: Never    Passive exposure: Never   Smokeless tobacco: Never  Vaping Use   Vaping status: Never Used  Substance Use Topics   Alcohol use: Not Currently   Drug use: No   Social History   Social History Narrative   Not on file      Immunization History  Administered Date(s) Administered   Tdap 08/29/2019, 10/20/2021     Objective: Vital Signs: BP 106/75   Pulse 68   Temp 98.2 F (36.8 C)   Resp 14   Ht 5' 7 (1.702 m)   Wt 180 lb 3.2 oz (81.7 kg)   LMP 01/14/2024   BMI 28.22 kg/m    Physical Exam Vitals and nursing note reviewed.  Constitutional:      Appearance: She is well-developed.  HENT:     Head: Normocephalic and atraumatic.  Eyes:     Conjunctiva/sclera: Conjunctivae normal.  Cardiovascular:     Rate and Rhythm: Normal rate and regular rhythm.     Heart sounds: Normal heart sounds.  Pulmonary:     Effort: Pulmonary effort is normal.     Breath sounds: Normal breath sounds.  Abdominal:     General: Bowel sounds are normal.     Palpations: Abdomen is soft.  Musculoskeletal:     Cervical back: Normal range of motion.  Lymphadenopathy:     Cervical: No cervical adenopathy.  Skin:    General: Skin is warm and dry.     Capillary Refill: Capillary refill takes less than 2 seconds.  Neurological:     Mental Status: She is alert and oriented to person, place, and time.  Psychiatric:        Behavior: Behavior normal.      Musculoskeletal Exam: C-spine, thoracic spine, lumbar spine have good range of motion.  No midline spinal tenderness.  No SI joint tenderness.  Shoulder joints, elbow joints, wrist joints, MCPs, PIPs, DIPs have good range of motion with no synovitis.  Complete fist formation bilaterally.  Hip joints have good range of motion with no groin pain.  Knee joints have good range of motion no warmth or effusion.  Ankle joints have good range of motion no tenderness or joint swelling.  No evidence of Achilles tendinitis or plantar fasciitis.   CDAI Exam: CDAI Score: -- Patient Global: --; Provider Global: -- Swollen: --; Tender: -- Joint Exam 01/14/2024   No joint exam has been documented for this visit   There is currently no information documented on the homunculus.  Go to the Rheumatology activity and complete the homunculus joint exam.  Investigation: No additional findings.  Imaging: No results found.  Recent Labs: Lab Results  Component Value Date   WBC 7.7 11/26/2023   HGB 9.7 (L) 11/26/2023   PLT 364 11/26/2023   NA 138 09/03/2023   K 3.9 09/03/2023   CL 103 09/03/2023   CO2 24 09/03/2023   GLUCOSE 73 09/03/2023   BUN 11 09/03/2023   CREATININE 0.65 09/03/2023   BILITOT 0.4 09/03/2023   AST 16 09/03/2023   ALT 14 09/03/2023   PROT 7.9 09/03/2023   CALCIUM 9.5 09/03/2023   QFTBGOLDPLUS NEGATIVE 08/17/2022    Speciality Comments: PLQ Eye  Exam: 10/04/2022 WNL @ [facility] Follow up in 1 year  Patient scheduled PLQ eye exam for 01/25/2024, patient states that is the soonest they had available  Procedures:  No procedures performed Allergies: Patient has no known allergies.    Assessment / Plan:     Visit Diagnoses: Other systemic lupus erythematosus with other organ involvement (HCC) - - Positive ANA, positive SSA, positive lupus anticoagulant, history of fatigue, joint pain, chest tightness, fetal congenital heart block 2021: She has not had any signs or symptoms of a systemic lupus flare.  She has clinically been doing well taking Plaquenil  200 mg 1 tablet by mouth twice daily.  She is tolerating Plaquenil  without any side effects and has not had any gaps in therapy.  No oral or nasal ulcerations.  No recent rashes.  No symptoms of Raynaud's phenomenon.  No recent hair loss.  She has no synovitis on examination today. No sicca symptoms.  Her energy level has been improving since starting to take ferrous sulfate.  She is under the care of hematology for management of iron deficiency anemia. Lab work from 09/03/23 was reviewed today in the office: dsDNA negative, C4 low-9, C3 WNL, ESR WNL.  Plan to update the following lab work today.  She will remain on Plaquenil  as prescribed.  She was advised to notify us  if she develops  any signs or symptoms of a flare.  She will follow-up in the office in 5 months or sooner if needed. - Plan: C3 and C4, Sedimentation rate, Comprehensive metabolic panel with GFR, Anti-DNA antibody, double-stranded, Protein / creatinine ratio, urine  Lupus anticoagulant positive - Lupus anticoagulant not detected on 06/27/2022.  Repeat lupus anticoagulant has been negative.  High risk medication use - Plaquenil  200 mg 1 tablet by mouth twice daily- started in early April 2023.  PLQ Eye Exam: 10/04/2022 WNL @ [facility] Follow up in 1 year  CMP updated on 09/03/23. Order for CMP released today.  CBC updated on 11/26/23. - Plan: Comprehensive metabolic panel with GFR  Pain in both hands - X-rays of both hands were unremarkable on 06/27/2022.  No tenderness or synovitis noted.   History of iron deficiency anemia: Patient has established care with hematology-reviewed note from 11/26/23-She has started taking ferrous sulfate 325 mg BID.  Other fatigue: Chronic-improving since starting ferrous sulfate.   Iron deficiency  Heart block, fetal, affecting care of mother  Orders: Orders Placed This Encounter  Procedures   C3 and C4   Sedimentation rate   Comprehensive metabolic panel with GFR   Anti-DNA antibody, double-stranded   Protein / creatinine ratio, urine   No orders of the defined types were placed in this encounter.    Follow-Up Instructions: Return in about 5 months (around 06/13/2024) for Systemic lupus erythematosus.   Waddell CHRISTELLA Craze, PA-C  Note - This record has been created using Dragon software.  Chart creation errors have been sought, but may not always  have been located. Such creation errors do not reflect on  the standard of medical care.

## 2024-01-14 ENCOUNTER — Encounter: Payer: Self-pay | Admitting: Physician Assistant

## 2024-01-14 ENCOUNTER — Ambulatory Visit: Attending: Physician Assistant | Admitting: Physician Assistant

## 2024-01-14 VITALS — BP 106/75 | HR 68 | Temp 98.2°F | Resp 14 | Ht 67.0 in | Wt 180.2 lb

## 2024-01-14 DIAGNOSIS — Z79899 Other long term (current) drug therapy: Secondary | ICD-10-CM | POA: Insufficient documentation

## 2024-01-14 DIAGNOSIS — Z862 Personal history of diseases of the blood and blood-forming organs and certain disorders involving the immune mechanism: Secondary | ICD-10-CM | POA: Diagnosis present

## 2024-01-14 DIAGNOSIS — O35BXX0 Maternal care for other (suspected) fetal abnormality and damage, fetal cardiac anomalies, not applicable or unspecified: Secondary | ICD-10-CM | POA: Insufficient documentation

## 2024-01-14 DIAGNOSIS — R5383 Other fatigue: Secondary | ICD-10-CM | POA: Insufficient documentation

## 2024-01-14 DIAGNOSIS — M79642 Pain in left hand: Secondary | ICD-10-CM | POA: Insufficient documentation

## 2024-01-14 DIAGNOSIS — M79641 Pain in right hand: Secondary | ICD-10-CM | POA: Diagnosis present

## 2024-01-14 DIAGNOSIS — R760 Raised antibody titer: Secondary | ICD-10-CM | POA: Diagnosis present

## 2024-01-14 DIAGNOSIS — M3219 Other organ or system involvement in systemic lupus erythematosus: Secondary | ICD-10-CM | POA: Diagnosis present

## 2024-01-14 DIAGNOSIS — E611 Iron deficiency: Secondary | ICD-10-CM | POA: Insufficient documentation

## 2024-01-15 ENCOUNTER — Ambulatory Visit: Payer: Self-pay | Admitting: Physician Assistant

## 2024-01-15 LAB — COMPREHENSIVE METABOLIC PANEL WITH GFR
AG Ratio: 1.2 (calc) (ref 1.0–2.5)
ALT: 11 U/L (ref 6–29)
AST: 14 U/L (ref 10–30)
Albumin: 4.3 g/dL (ref 3.6–5.1)
Alkaline phosphatase (APISO): 69 U/L (ref 31–125)
BUN: 13 mg/dL (ref 7–25)
CO2: 25 mmol/L (ref 20–32)
Calcium: 9.8 mg/dL (ref 8.6–10.2)
Chloride: 104 mmol/L (ref 98–110)
Creat: 0.73 mg/dL (ref 0.50–0.96)
Globulin: 3.5 g/dL (ref 1.9–3.7)
Glucose, Bld: 74 mg/dL (ref 65–99)
Potassium: 4.3 mmol/L (ref 3.5–5.3)
Sodium: 137 mmol/L (ref 135–146)
Total Bilirubin: 0.3 mg/dL (ref 0.2–1.2)
Total Protein: 7.8 g/dL (ref 6.1–8.1)
eGFR: 116 mL/min/1.73m2 (ref >=60)

## 2024-01-15 LAB — ANTI-DNA ANTIBODY, DOUBLE-STRANDED: ds DNA Ab: 2 [IU]/mL

## 2024-01-15 LAB — C3 AND C4
C3 Complement: 130 mg/dL (ref 83–193)
C4 Complement: 15 mg/dL (ref 15–57)

## 2024-01-15 LAB — PROTEIN / CREATININE RATIO, URINE
Creatinine, Urine: 60 mg/dL (ref 20–275)
Protein/Creat Ratio: 67 mg/g{creat} (ref 24–184)
Protein/Creatinine Ratio: 0.067 mg/mg{creat} (ref 0.024–0.184)
Total Protein, Urine: 4 mg/dL — ABNORMAL LOW (ref 5–24)

## 2024-01-15 LAB — SEDIMENTATION RATE: Sed Rate: 14 mm/h (ref 0–20)

## 2024-01-15 NOTE — Progress Notes (Signed)
 No proteinuria  CMP WNL ESR WNL Complements WNL

## 2024-01-16 NOTE — Progress Notes (Signed)
dsDNA negative.  Labs are not consistent with a flare.

## 2024-01-25 LAB — OPHTHALMOLOGY REPORT-SCANNED

## 2024-02-02 ENCOUNTER — Other Ambulatory Visit: Payer: Self-pay | Admitting: Physician Assistant

## 2024-02-04 ENCOUNTER — Other Ambulatory Visit: Payer: Self-pay | Admitting: Obstetrics and Gynecology

## 2024-02-04 DIAGNOSIS — Z30011 Encounter for initial prescription of contraceptive pills: Secondary | ICD-10-CM

## 2024-02-04 NOTE — Telephone Encounter (Signed)
 Last Fill: 01/02/2024 (30 day supply)  Eye exam: 01/25/2024 WNL   Labs: 01/14/2024 No proteinuria  CMP WNL ESR WNL Complements WNL 11/26/2023 Hct 32.4, MCV 69.7, MCH 20.9, MCHC 29.9, RDW 18.0  Next Visit: 06/16/2024  Last Visit: 01/14/2024  IK:Nuyzm systemic lupus erythematosus with other organ involvement   Current Dose per office note 01/14/2024: Plaquenil  200 mg 1 tablet by mouth twice daily   Okay to refill Plaquenil ?

## 2024-03-04 IMAGING — US US OB COMP LESS 14 WK
1 series · 15 of 28 positions shown · non-contrast
Comparison: None.

CLINICAL DATA: Right lower quadrant pain.

EXAM:
OBSTETRIC <14 WK ULTRASOUND
TECHNIQUE: Transabdominal ultrasound was performed for evaluation of the
gestation as well as the maternal uterus and adnexal regions.

[Series 1: us ob comp less 14 wk · 32 acquisitions, 15 frames shown]
[im 1/32]
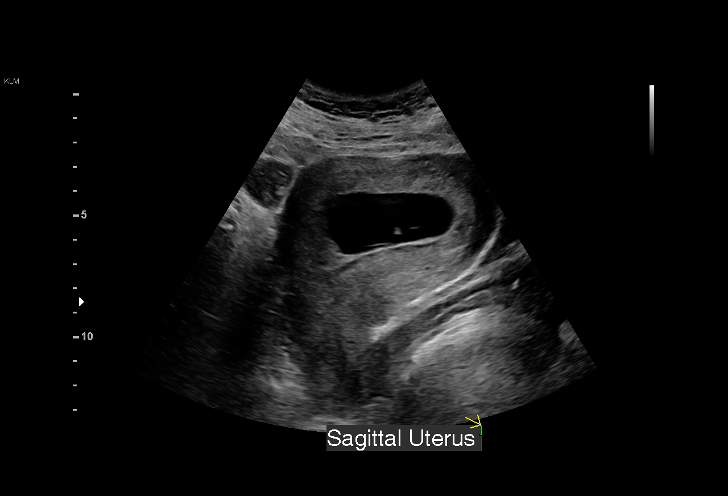
[im 3/32]
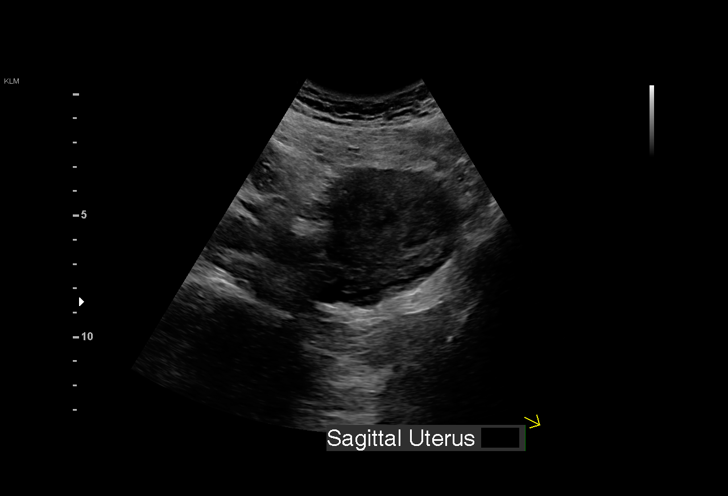
[im 5/32]
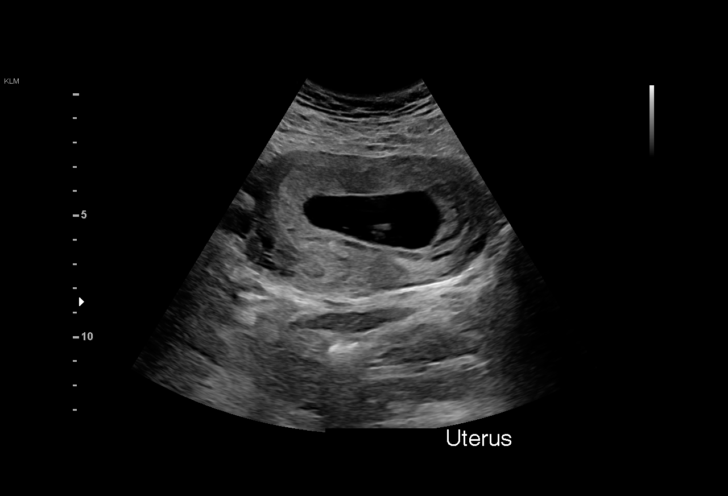
[im 7/32]
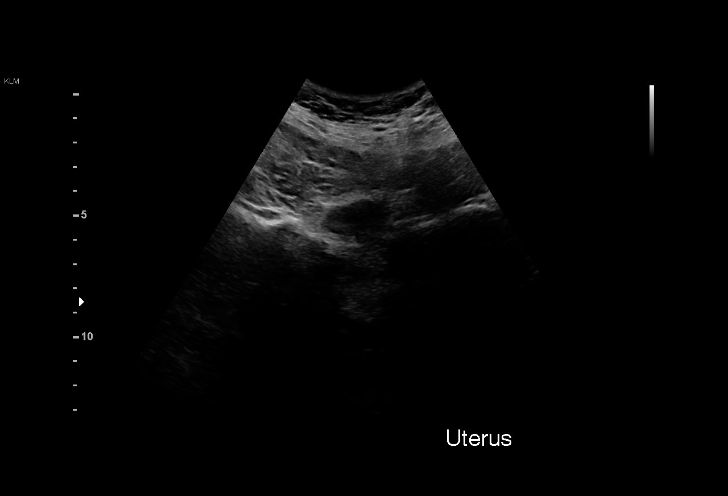
[im 10/32]
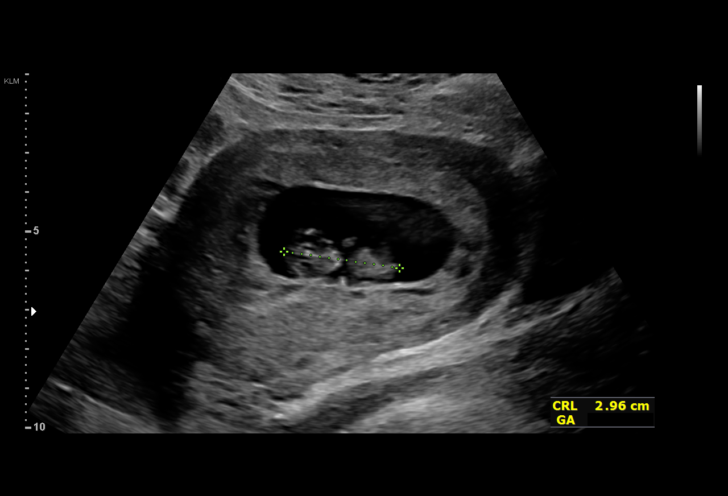
[im 12/32]
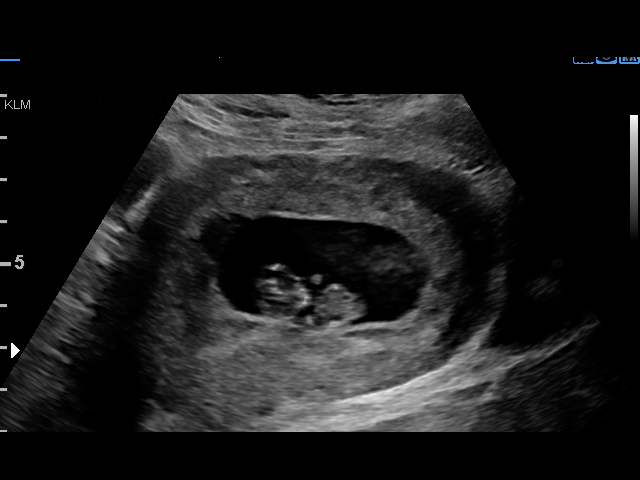
[im 14/32]
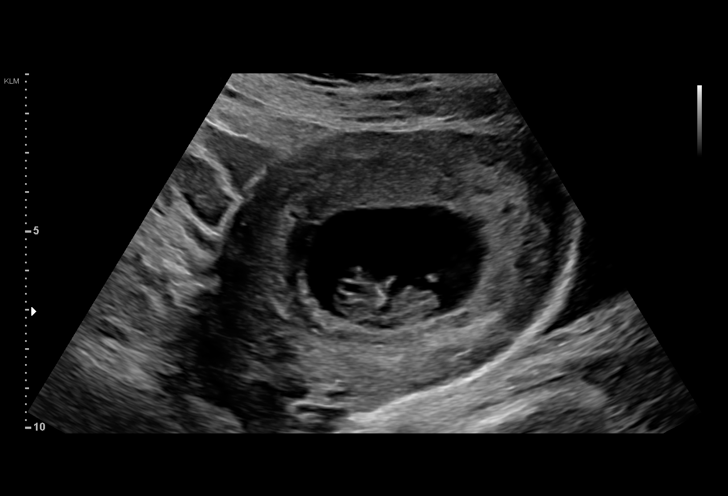
[im 17/32]
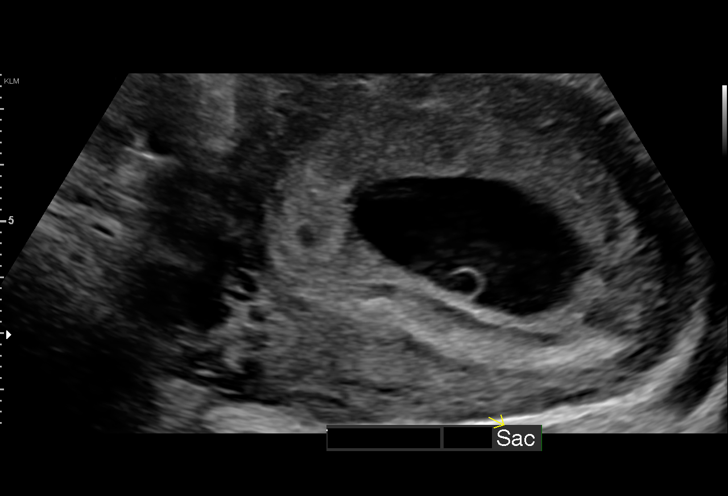
[im 18/32]
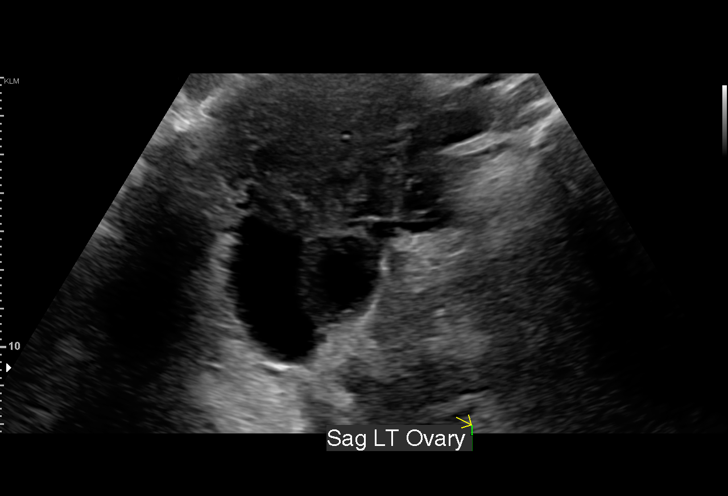
[im 20/32]
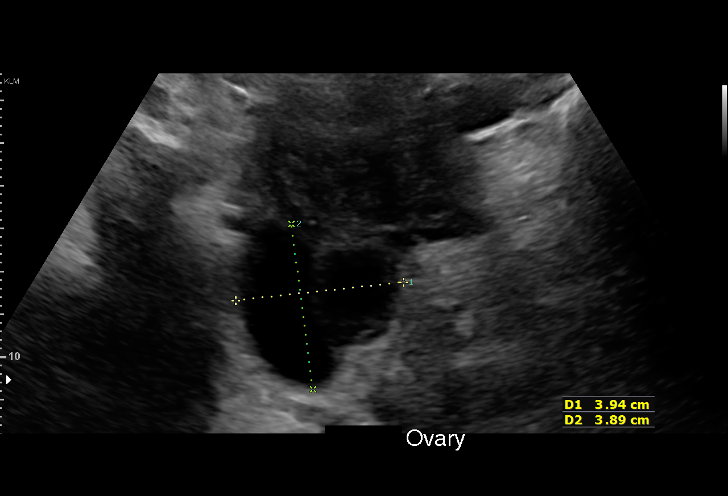
[im 22/32]
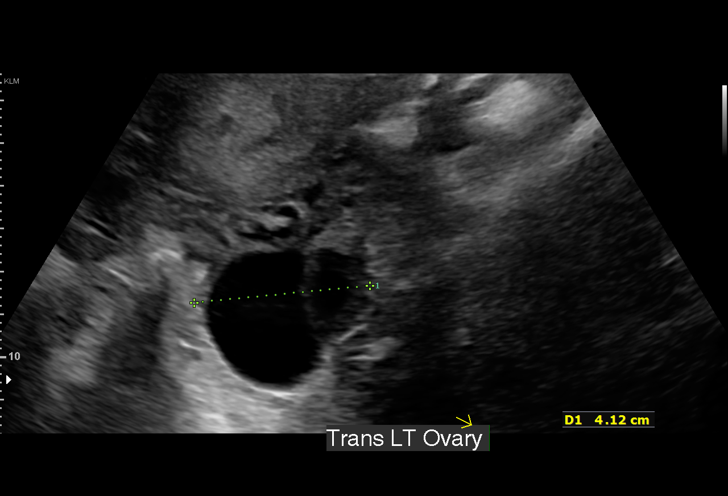
[im 25/32]
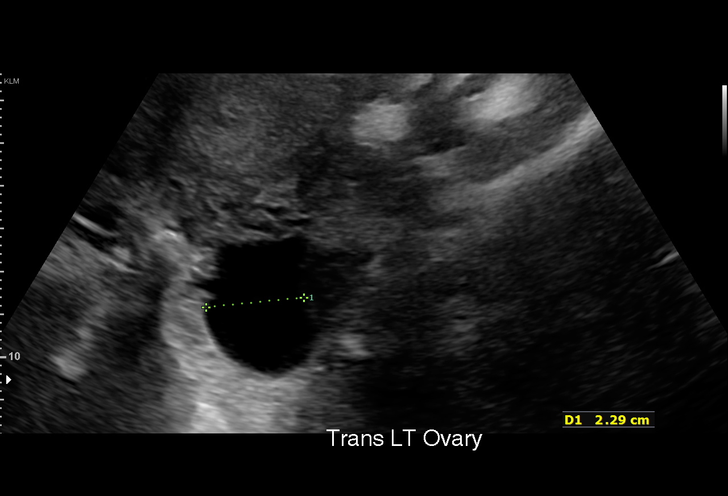
[im 27/32]
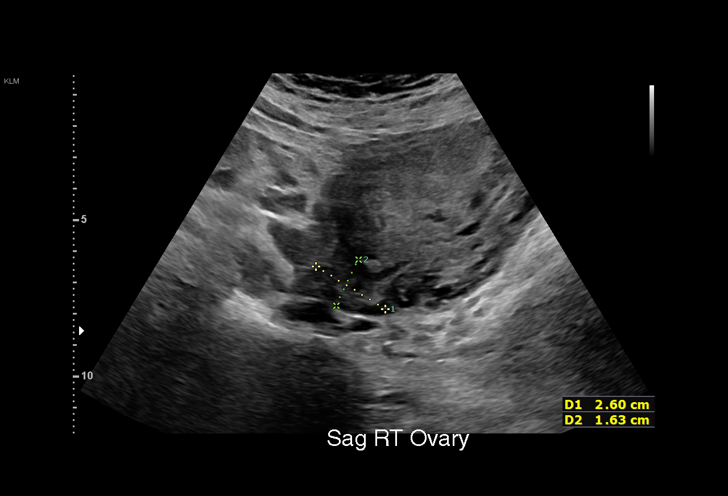
[im 29/32]
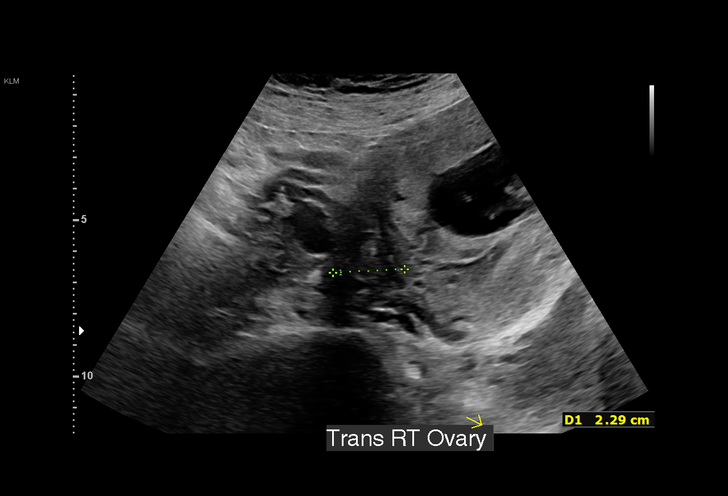
[im 32/32]
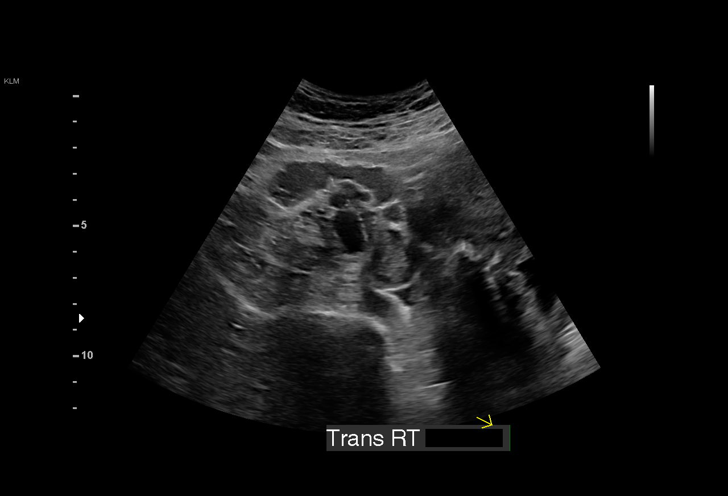

[15 of 28 positions shown; findings below may reference images not displayed]

FINDINGS: Intrauterine gestational sac: Single

Yolk sac:  Visualized.

Embryo:  Visualized.

Cardiac Activity: Visualized.

Heart Rate: 166 bpm

CRL:   28.4 mm   9 w 4 d                  US EDC: January 03, 2022

Subchorionic hemorrhage:  None visualized.

Maternal uterus/adnexae: The right ovary is visualized and is normal
in appearance.

A 3.2 cm x 2.4 cm x 2.4 cm simple cyst is seen within the left
ovary.

No pelvic free fluid is identified.
IMPRESSION: Single, viable intrauterine pregnancy at approximately 9 weeks and 4
days gestation by ultrasound evaluation.

## 2024-06-16 ENCOUNTER — Ambulatory Visit: Admitting: Physician Assistant
